# Patient Record
Sex: Male | Born: 1955
Health system: Southern US, Community
[De-identification: ages and names within clinical notes are randomized; demographics above are authoritative.]

## PROBLEM LIST (undated history)

## (undated) DIAGNOSIS — U071 COVID-19: Secondary | ICD-10-CM

## (undated) DIAGNOSIS — C3411 Malignant neoplasm of upper lobe, right bronchus or lung: Secondary | ICD-10-CM

## (undated) DIAGNOSIS — J449 Chronic obstructive pulmonary disease, unspecified: Secondary | ICD-10-CM

## (undated) DIAGNOSIS — M48061 Spinal stenosis, lumbar region without neurogenic claudication: Secondary | ICD-10-CM

## (undated) DIAGNOSIS — F1010 Alcohol abuse, uncomplicated: Secondary | ICD-10-CM

## (undated) DIAGNOSIS — F1411 Cocaine abuse, in remission: Secondary | ICD-10-CM

## (undated) DIAGNOSIS — M1990 Unspecified osteoarthritis, unspecified site: Secondary | ICD-10-CM

## (undated) DIAGNOSIS — M109 Gout, unspecified: Secondary | ICD-10-CM

## (undated) DIAGNOSIS — E785 Hyperlipidemia, unspecified: Secondary | ICD-10-CM

## (undated) DIAGNOSIS — T17500A Unspecified foreign body in bronchus causing asphyxiation, initial encounter: Secondary | ICD-10-CM

## (undated) DIAGNOSIS — E871 Hypo-osmolality and hyponatremia: Secondary | ICD-10-CM

## (undated) DIAGNOSIS — Z8489 Family history of other specified conditions: Secondary | ICD-10-CM

## (undated) DIAGNOSIS — N400 Enlarged prostate without lower urinary tract symptoms: Secondary | ICD-10-CM

## (undated) DIAGNOSIS — C44629 Squamous cell carcinoma of skin of left upper limb, including shoulder: Secondary | ICD-10-CM

## (undated) DIAGNOSIS — M5090 Cervical disc disorder, unspecified, unspecified cervical region: Secondary | ICD-10-CM

## (undated) DIAGNOSIS — J189 Pneumonia, unspecified organism: Secondary | ICD-10-CM

## (undated) DIAGNOSIS — K219 Gastro-esophageal reflux disease without esophagitis: Secondary | ICD-10-CM

## (undated) DIAGNOSIS — I10 Essential (primary) hypertension: Secondary | ICD-10-CM

## (undated) DIAGNOSIS — A419 Sepsis, unspecified organism: Secondary | ICD-10-CM

## (undated) DIAGNOSIS — Z720 Tobacco use: Secondary | ICD-10-CM

## (undated) DIAGNOSIS — R911 Solitary pulmonary nodule: Secondary | ICD-10-CM

## (undated) DIAGNOSIS — R000 Tachycardia, unspecified: Secondary | ICD-10-CM

## (undated) HISTORY — DX: Gout, unspecified: M10.9

## (undated) HISTORY — PX: NO PAST SURGERIES: SHX2092

## (undated) HISTORY — PX: OTHER SURGICAL HISTORY: SHX169

## (undated) HISTORY — DX: Malignant neoplasm of upper lobe, right bronchus or lung: C34.11

---

## 1991-04-07 DIAGNOSIS — K759 Inflammatory liver disease, unspecified: Secondary | ICD-10-CM

## 1991-04-07 HISTORY — DX: Inflammatory liver disease, unspecified: K75.9

## 2019-02-17 DIAGNOSIS — E538 Deficiency of other specified B group vitamins: Secondary | ICD-10-CM | POA: Diagnosis not present

## 2019-02-17 DIAGNOSIS — R739 Hyperglycemia, unspecified: Secondary | ICD-10-CM | POA: Diagnosis not present

## 2019-02-17 DIAGNOSIS — Z23 Encounter for immunization: Secondary | ICD-10-CM | POA: Diagnosis not present

## 2019-02-17 DIAGNOSIS — Z125 Encounter for screening for malignant neoplasm of prostate: Secondary | ICD-10-CM | POA: Diagnosis not present

## 2019-02-17 DIAGNOSIS — Z0000 Encounter for general adult medical examination without abnormal findings: Secondary | ICD-10-CM | POA: Diagnosis not present

## 2019-02-17 DIAGNOSIS — I10 Essential (primary) hypertension: Secondary | ICD-10-CM | POA: Diagnosis present

## 2019-02-17 DIAGNOSIS — E782 Mixed hyperlipidemia: Secondary | ICD-10-CM | POA: Diagnosis not present

## 2019-04-03 DIAGNOSIS — M659 Synovitis and tenosynovitis, unspecified: Secondary | ICD-10-CM | POA: Diagnosis not present

## 2019-04-03 DIAGNOSIS — I10 Essential (primary) hypertension: Secondary | ICD-10-CM | POA: Diagnosis not present

## 2019-04-03 DIAGNOSIS — Z1212 Encounter for screening for malignant neoplasm of rectum: Secondary | ICD-10-CM | POA: Diagnosis not present

## 2019-04-03 DIAGNOSIS — Z0000 Encounter for general adult medical examination without abnormal findings: Secondary | ICD-10-CM | POA: Diagnosis not present

## 2019-05-13 DIAGNOSIS — U071 COVID-19: Secondary | ICD-10-CM | POA: Diagnosis not present

## 2019-05-15 DIAGNOSIS — J1282 Pneumonia due to coronavirus disease 2019: Secondary | ICD-10-CM | POA: Diagnosis not present

## 2019-05-15 DIAGNOSIS — U071 COVID-19: Secondary | ICD-10-CM | POA: Diagnosis not present

## 2019-05-15 DIAGNOSIS — J208 Acute bronchitis due to other specified organisms: Secondary | ICD-10-CM | POA: Diagnosis not present

## 2019-05-16 DIAGNOSIS — J1282 Pneumonia due to coronavirus disease 2019: Secondary | ICD-10-CM | POA: Diagnosis not present

## 2019-05-16 DIAGNOSIS — U071 COVID-19: Secondary | ICD-10-CM | POA: Diagnosis not present

## 2019-05-18 DIAGNOSIS — U071 COVID-19: Secondary | ICD-10-CM | POA: Diagnosis not present

## 2019-05-18 DIAGNOSIS — J208 Acute bronchitis due to other specified organisms: Secondary | ICD-10-CM | POA: Diagnosis not present

## 2019-05-22 DIAGNOSIS — U071 COVID-19: Secondary | ICD-10-CM | POA: Diagnosis not present

## 2019-05-22 DIAGNOSIS — J208 Acute bronchitis due to other specified organisms: Secondary | ICD-10-CM | POA: Diagnosis not present

## 2019-06-21 DIAGNOSIS — J1282 Pneumonia due to coronavirus disease 2019: Secondary | ICD-10-CM | POA: Diagnosis not present

## 2019-06-21 DIAGNOSIS — U071 COVID-19: Secondary | ICD-10-CM | POA: Diagnosis not present

## 2019-06-21 DIAGNOSIS — R0600 Dyspnea, unspecified: Secondary | ICD-10-CM | POA: Diagnosis not present

## 2019-07-24 DIAGNOSIS — I10 Essential (primary) hypertension: Secondary | ICD-10-CM | POA: Diagnosis not present

## 2019-07-24 DIAGNOSIS — U071 COVID-19: Secondary | ICD-10-CM | POA: Diagnosis not present

## 2019-07-24 DIAGNOSIS — J1282 Pneumonia due to coronavirus disease 2019: Secondary | ICD-10-CM | POA: Diagnosis not present

## 2019-07-24 DIAGNOSIS — M5414 Radiculopathy, thoracic region: Secondary | ICD-10-CM | POA: Diagnosis not present

## 2019-07-24 DIAGNOSIS — Z125 Encounter for screening for malignant neoplasm of prostate: Secondary | ICD-10-CM | POA: Diagnosis not present

## 2019-07-24 DIAGNOSIS — Z79899 Other long term (current) drug therapy: Secondary | ICD-10-CM | POA: Diagnosis not present

## 2019-10-19 DIAGNOSIS — R55 Syncope and collapse: Secondary | ICD-10-CM | POA: Diagnosis not present

## 2019-11-02 DIAGNOSIS — R55 Syncope and collapse: Secondary | ICD-10-CM | POA: Diagnosis not present

## 2019-11-02 DIAGNOSIS — M109 Gout, unspecified: Secondary | ICD-10-CM | POA: Diagnosis not present

## 2019-11-02 DIAGNOSIS — I10 Essential (primary) hypertension: Secondary | ICD-10-CM | POA: Diagnosis not present

## 2019-11-02 DIAGNOSIS — Z1212 Encounter for screening for malignant neoplasm of rectum: Secondary | ICD-10-CM | POA: Diagnosis not present

## 2019-12-13 DIAGNOSIS — C44629 Squamous cell carcinoma of skin of left upper limb, including shoulder: Secondary | ICD-10-CM | POA: Diagnosis not present

## 2019-12-13 DIAGNOSIS — D485 Neoplasm of uncertain behavior of skin: Secondary | ICD-10-CM | POA: Diagnosis not present

## 2019-12-13 DIAGNOSIS — Z1212 Encounter for screening for malignant neoplasm of rectum: Secondary | ICD-10-CM | POA: Diagnosis not present

## 2019-12-17 LAB — COLOGUARD: COLOGUARD: NEGATIVE

## 2020-01-08 DIAGNOSIS — H2513 Age-related nuclear cataract, bilateral: Secondary | ICD-10-CM | POA: Diagnosis not present

## 2020-01-08 DIAGNOSIS — H5203 Hypermetropia, bilateral: Secondary | ICD-10-CM | POA: Diagnosis not present

## 2020-01-08 DIAGNOSIS — I10 Essential (primary) hypertension: Secondary | ICD-10-CM | POA: Diagnosis not present

## 2020-01-11 DIAGNOSIS — C44629 Squamous cell carcinoma of skin of left upper limb, including shoulder: Secondary | ICD-10-CM | POA: Diagnosis not present

## 2020-01-25 DIAGNOSIS — L570 Actinic keratosis: Secondary | ICD-10-CM | POA: Diagnosis not present

## 2020-01-25 DIAGNOSIS — L905 Scar conditions and fibrosis of skin: Secondary | ICD-10-CM | POA: Diagnosis not present

## 2020-02-12 DIAGNOSIS — Z125 Encounter for screening for malignant neoplasm of prostate: Secondary | ICD-10-CM | POA: Diagnosis not present

## 2020-02-12 DIAGNOSIS — Z79899 Other long term (current) drug therapy: Secondary | ICD-10-CM | POA: Diagnosis not present

## 2020-02-19 DIAGNOSIS — Z0000 Encounter for general adult medical examination without abnormal findings: Secondary | ICD-10-CM | POA: Diagnosis not present

## 2020-02-19 DIAGNOSIS — Z125 Encounter for screening for malignant neoplasm of prostate: Secondary | ICD-10-CM | POA: Diagnosis not present

## 2020-02-19 DIAGNOSIS — E782 Mixed hyperlipidemia: Secondary | ICD-10-CM | POA: Insufficient documentation

## 2020-02-19 DIAGNOSIS — C44629 Squamous cell carcinoma of skin of left upper limb, including shoulder: Secondary | ICD-10-CM | POA: Insufficient documentation

## 2020-11-08 DIAGNOSIS — M5010 Cervical disc disorder with radiculopathy, unspecified cervical region: Secondary | ICD-10-CM | POA: Diagnosis not present

## 2020-11-08 DIAGNOSIS — R29898 Other symptoms and signs involving the musculoskeletal system: Secondary | ICD-10-CM | POA: Diagnosis not present

## 2020-11-22 ENCOUNTER — Other Ambulatory Visit: Payer: Self-pay | Admitting: Internal Medicine

## 2020-11-22 DIAGNOSIS — M5010 Cervical disc disorder with radiculopathy, unspecified cervical region: Secondary | ICD-10-CM | POA: Diagnosis not present

## 2020-11-22 DIAGNOSIS — Z0000 Encounter for general adult medical examination without abnormal findings: Secondary | ICD-10-CM | POA: Diagnosis not present

## 2020-11-24 ENCOUNTER — Ambulatory Visit
Admission: RE | Admit: 2020-11-24 | Discharge: 2020-11-24 | Disposition: A | Discharge status: Home / Self Care | Payer: PPO | Source: Ambulatory Visit | Attending: Internal Medicine | Admitting: Internal Medicine

## 2020-11-24 DIAGNOSIS — M5010 Cervical disc disorder with radiculopathy, unspecified cervical region: Secondary | ICD-10-CM

## 2020-11-24 DIAGNOSIS — M542 Cervicalgia: Secondary | ICD-10-CM | POA: Diagnosis not present

## 2020-11-27 DIAGNOSIS — M5412 Radiculopathy, cervical region: Secondary | ICD-10-CM | POA: Diagnosis not present

## 2020-11-29 DIAGNOSIS — M5412 Radiculopathy, cervical region: Secondary | ICD-10-CM | POA: Diagnosis not present

## 2020-12-04 DIAGNOSIS — M5010 Cervical disc disorder with radiculopathy, unspecified cervical region: Secondary | ICD-10-CM | POA: Diagnosis not present

## 2020-12-12 DIAGNOSIS — M542 Cervicalgia: Secondary | ICD-10-CM | POA: Diagnosis not present

## 2020-12-12 DIAGNOSIS — M5412 Radiculopathy, cervical region: Secondary | ICD-10-CM | POA: Diagnosis not present

## 2020-12-12 DIAGNOSIS — M79601 Pain in right arm: Secondary | ICD-10-CM | POA: Diagnosis not present

## 2020-12-12 DIAGNOSIS — R293 Abnormal posture: Secondary | ICD-10-CM | POA: Diagnosis not present

## 2020-12-19 ENCOUNTER — Other Ambulatory Visit: Payer: Self-pay | Admitting: Neurosurgery

## 2020-12-19 DIAGNOSIS — M5412 Radiculopathy, cervical region: Secondary | ICD-10-CM | POA: Diagnosis not present

## 2020-12-24 ENCOUNTER — Other Ambulatory Visit: Payer: Self-pay

## 2020-12-24 ENCOUNTER — Encounter
Admission: RE | Admit: 2020-12-24 | Discharge: 2020-12-24 | Disposition: A | Discharge status: Home / Self Care | Payer: PPO | Source: Ambulatory Visit | Attending: Neurosurgery | Admitting: Neurosurgery

## 2020-12-24 DIAGNOSIS — I10 Essential (primary) hypertension: Secondary | ICD-10-CM | POA: Diagnosis not present

## 2020-12-24 DIAGNOSIS — Z01818 Encounter for other preprocedural examination: Secondary | ICD-10-CM | POA: Insufficient documentation

## 2020-12-24 HISTORY — DX: Squamous cell carcinoma of skin of left upper limb, including shoulder: C44.629

## 2020-12-24 HISTORY — DX: Essential (primary) hypertension: I10

## 2020-12-24 HISTORY — DX: Gastro-esophageal reflux disease without esophagitis: K21.9

## 2020-12-24 HISTORY — DX: Unspecified osteoarthritis, unspecified site: M19.90

## 2020-12-24 LAB — BASIC METABOLIC PANEL
Anion gap: 10 (ref 5–15)
BUN: 8 mg/dL (ref 8–23)
CO2: 29 mmol/L (ref 22–32)
Calcium: 9.5 mg/dL (ref 8.9–10.3)
Chloride: 100 mmol/L (ref 98–111)
Creatinine, Ser: 0.78 mg/dL (ref 0.61–1.24)
GFR, Estimated: >60 mL/min (ref >60)
Glucose, Bld: 112 mg/dL — ABNORMAL HIGH (ref 70–99)
Potassium: 3.9 mmol/L (ref 3.5–5.1)
Sodium: 139 mmol/L (ref 135–145)

## 2020-12-24 LAB — URINALYSIS, ROUTINE W REFLEX MICROSCOPIC
Bacteria, UA: NONE SEEN
Bilirubin Urine: NEGATIVE
Glucose, UA: NEGATIVE mg/dL
Hgb urine dipstick: NEGATIVE
Ketones, ur: NEGATIVE mg/dL
Leukocytes,Ua: NEGATIVE
Nitrite: NEGATIVE
Protein, ur: NEGATIVE mg/dL
Specific Gravity, Urine: 1.015 (ref 1.005–1.030)
Squamous Epithelial / HPF: NONE SEEN (ref 0–5)
pH: 7 (ref 5.0–8.0)

## 2020-12-24 LAB — TYPE AND SCREEN
ABO/RH(D): O POS
Antibody Screen: NEGATIVE

## 2020-12-24 LAB — PROTIME-INR
INR: 1 (ref 0.8–1.2)
Prothrombin Time: 12.9 seconds (ref 11.4–15.2)

## 2020-12-24 LAB — CBC
HCT: 40.3 % (ref 39.0–52.0)
Hemoglobin: 15.1 g/dL (ref 13.0–17.0)
MCH: 36.8 pg — ABNORMAL HIGH (ref 26.0–34.0)
MCHC: 37.5 g/dL — ABNORMAL HIGH (ref 30.0–36.0)
MCV: 98.3 fL (ref 80.0–100.0)
Platelets: 186 10*3/uL (ref 150–400)
RBC: 4.1 MIL/uL — ABNORMAL LOW (ref 4.22–5.81)
RDW: 13.7 % (ref 11.5–15.5)
WBC: 6.4 10*3/uL (ref 4.0–10.5)
nRBC: 0 % (ref 0.0–0.2)

## 2020-12-24 LAB — SURGICAL PCR SCREEN
MRSA, PCR: NEGATIVE
Staphylococcus aureus: NEGATIVE

## 2020-12-24 LAB — APTT: aPTT: 32 seconds (ref 24–36)

## 2020-12-24 NOTE — Patient Instructions (Addendum)
Your procedure is scheduled on: Wednesday, September 28 Report to the Registration Desk on the 1st floor of the [facility]. To find out your arrival time, please call [phone] between 1PM - 3PM on: Tuesday, September 27  REMEMBER: Instructions that are not followed completely may result in serious medical risk, up to and including death; or upon the discretion of your surgeon and anesthesiologist your surgery may need to be rescheduled.  Do not eat food after midnight the night before surgery.  No gum chewing, lozengers or hard candies.  You may however, drink CLEAR liquids up to 2 hours before you are scheduled to arrive for your surgery. Do not drink anything within 2 hours of your scheduled arrival time.  Clear liquids include: - water  - apple juice without pulp - gatorade (not RED, PURPLE, OR BLUE) - black coffee or tea (Do NOT add milk or creamers to the coffee or tea) Do NOT drink anything that is not on this list.  TAKE THESE MEDICATIONS THE MORNING OF SURGERY WITH A SIP OF WATER:  Omeprazole (prilosec) - (take one the night before and one on the morning of surgery - helps to prevent nausea after surgery.) Hydrocodone if needed for pain  One week prior to surgery: starting September 21 Stop ETODOLAC (Lodine) and Anti-inflammatories (NSAIDS) such as Advil, Aleve, Ibuprofen, Motrin, Naproxen, Naprosyn and Aspirin based products such as Excedrin, Goodys Powder, BC Powder. Stop ANY OVER THE COUNTER supplements until after surgery. You may however, continue to take Tylenol if needed for pain up until the day of surgery.  No Alcohol for 24 hours before or after surgery.  No Smoking including e-cigarettes for 24 hours prior to surgery.  No chewable tobacco products for at least 6 hours prior to surgery.  No nicotine patches on the day of surgery.  Do not use any "recreational" drugs for at least a week prior to your surgery.  Please be advised that the combination of  cocaine and anesthesia may have negative outcomes, up to and including death. If you test positive for cocaine, your surgery will be cancelled.  On the morning of surgery brush your teeth with toothpaste and water, you may rinse your mouth with mouthwash if you wish. Do not swallow any toothpaste or mouthwash.  Use CHG Soap as directed on instruction sheet.  Do not wear jewelry.  Do not wear lotions, powders, or perfumes.   Do not shave body from the neck down 48 hours prior to surgery just in case you cut yourself which could leave a site for infection.  Also, freshly shaved skin may become irritated if using the CHG soap.  Contact lenses, hearing aids and dentures may not be worn into surgery.  Do not bring valuables to the hospital. [facility] is not responsible for any missing/lost belongings or valuables.   Notify your doctor if there is any change in your medical condition (cold, fever, infection).  Wear comfortable clothing (specific to your surgery type) to the hospital.  After surgery, you can help prevent lung complications by doing breathing exercises.  Take deep breaths and cough every 1-2 hours. Your doctor may order a device called an Incentive Spirometer to help you take deep breaths.  If you are being discharged the day of surgery, you will not be allowed to drive home. You will need a responsible adult (18 years or older) to drive you home and stay with you that night.   If you are taking public transportation,  you will need to have a responsible adult (18 years or older) with you. Please confirm with your physician that it is acceptable to use public transportation.   Please call the [place] at [phone] if you have any questions about these instructions.  Surgery Visitation Policy:  Patients undergoing a surgery or procedure may have one family member or support person with them as long as that person is not COVID-19 positive or  experiencing its symptoms.  That person may remain in the waiting area during the procedure and may rotate out with other people.

## 2020-12-31 MED ORDER — LACTATED RINGERS IV SOLN: INTRAVENOUS | Status: DC

## 2020-12-31 MED ORDER — CHLORHEXIDINE GLUCONATE 0.12 % MT SOLN: 15.0000 mL | Freq: Once | OROMUCOSAL | Status: AC

## 2020-12-31 MED ORDER — CEFAZOLIN SODIUM-DEXTROSE 1-4 GM/50ML-% IV SOLN
1.0000 g | INTRAVENOUS | Status: AC
  Administered 2021-01-01: 1 g via INTRAVENOUS

## 2020-12-31 MED ORDER — ORAL CARE MOUTH RINSE: 15.0000 mL | Freq: Once | OROMUCOSAL | Status: AC

## 2021-01-01 ENCOUNTER — Ambulatory Visit: Payer: PPO

## 2021-01-01 ENCOUNTER — Encounter
Admission: RE | Disposition: A | Discharge status: Home / Self Care | Payer: Self-pay | Source: Home / Self Care | Attending: Neurosurgery

## 2021-01-01 ENCOUNTER — Ambulatory Visit: Payer: PPO | Admitting: Urgent Care

## 2021-01-01 ENCOUNTER — Ambulatory Visit
Admission: RE | Admit: 2021-01-01 | Discharge: 2021-01-01 | Disposition: A | Discharge status: Home / Self Care | Payer: PPO | Attending: Neurosurgery | Admitting: Neurosurgery

## 2021-01-01 ENCOUNTER — Encounter: Payer: Self-pay | Admitting: Neurosurgery

## 2021-01-01 ENCOUNTER — Ambulatory Visit: Payer: PPO | Admitting: Anesthesiology

## 2021-01-01 DIAGNOSIS — M5412 Radiculopathy, cervical region: Secondary | ICD-10-CM | POA: Insufficient documentation

## 2021-01-01 DIAGNOSIS — R531 Weakness: Secondary | ICD-10-CM | POA: Insufficient documentation

## 2021-01-01 DIAGNOSIS — R29898 Other symptoms and signs involving the musculoskeletal system: Secondary | ICD-10-CM | POA: Diagnosis not present

## 2021-01-01 DIAGNOSIS — K219 Gastro-esophageal reflux disease without esophagitis: Secondary | ICD-10-CM | POA: Diagnosis not present

## 2021-01-01 DIAGNOSIS — Z87891 Personal history of nicotine dependence: Secondary | ICD-10-CM | POA: Insufficient documentation

## 2021-01-01 DIAGNOSIS — Z8616 Personal history of COVID-19: Secondary | ICD-10-CM | POA: Diagnosis not present

## 2021-01-01 DIAGNOSIS — Z79899 Other long term (current) drug therapy: Secondary | ICD-10-CM | POA: Diagnosis not present

## 2021-01-01 DIAGNOSIS — Z9889 Other specified postprocedural states: Secondary | ICD-10-CM | POA: Diagnosis not present

## 2021-01-01 DIAGNOSIS — Z419 Encounter for procedure for purposes other than remedying health state, unspecified: Secondary | ICD-10-CM

## 2021-01-01 HISTORY — PX: ANTERIOR CERVICAL DECOMP/DISCECTOMY FUSION: SHX1161

## 2021-01-01 LAB — ABO/RH: ABO/RH(D): O POS

## 2021-01-01 SURGERY — ANTERIOR CERVICAL DECOMPRESSION/DISCECTOMY FUSION 1 LEVEL
Anesthesia: General

## 2021-01-01 MED ORDER — KETAMINE HCL 50 MG/5ML IJ SOSY
PREFILLED_SYRINGE | INTRAMUSCULAR | Status: AC
  Filled 2021-01-01: qty 5

## 2021-01-01 MED ORDER — CHLORHEXIDINE GLUCONATE 0.12 % MT SOLN
OROMUCOSAL | Status: AC
  Administered 2021-01-01: 15 mL via OROMUCOSAL
  Filled 2021-01-01: qty 15

## 2021-01-01 MED ORDER — FENTANYL CITRATE (PF) 100 MCG/2ML IJ SOLN
INTRAMUSCULAR | Status: AC
  Administered 2021-01-01: 25 ug via INTRAVENOUS
  Filled 2021-01-01: qty 2

## 2021-01-01 MED ORDER — PROPOFOL 500 MG/50ML IV EMUL
INTRAVENOUS | Status: DC | PRN
  Administered 2021-01-01: 150 ug/kg/min via INTRAVENOUS

## 2021-01-01 MED ORDER — FENTANYL CITRATE (PF) 100 MCG/2ML IJ SOLN
INTRAMUSCULAR | Status: AC
  Filled 2021-01-01: qty 2

## 2021-01-01 MED ORDER — ACETAMINOPHEN 10 MG/ML IV SOLN: 1000.0000 mg | Freq: Once | INTRAVENOUS | Status: DC | PRN

## 2021-01-01 MED ORDER — CEFAZOLIN SODIUM-DEXTROSE 1-4 GM/50ML-% IV SOLN
INTRAVENOUS | Status: AC
  Filled 2021-01-01: qty 50

## 2021-01-01 MED ORDER — SODIUM CHLORIDE 0.9 % IV SOLN
INTRAVENOUS | Status: DC | PRN
  Administered 2021-01-01: .2 ug/kg/min via INTRAVENOUS

## 2021-01-01 MED ORDER — DEXAMETHASONE SODIUM PHOSPHATE 10 MG/ML IJ SOLN
INTRAMUSCULAR | Status: AC
  Filled 2021-01-01: qty 1

## 2021-01-01 MED ORDER — FENTANYL CITRATE (PF) 100 MCG/2ML IJ SOLN
25.0000 ug | INTRAMUSCULAR | Status: DC | PRN
  Administered 2021-01-01: 25 ug via INTRAVENOUS
  Administered 2021-01-01: 50 ug via INTRAVENOUS

## 2021-01-01 MED ORDER — BUPIVACAINE-EPINEPHRINE (PF) 0.5% -1:200000 IJ SOLN
INTRAMUSCULAR | Status: DC | PRN
  Administered 2021-01-01: 5 mL

## 2021-01-01 MED ORDER — DEXAMETHASONE SODIUM PHOSPHATE 10 MG/ML IJ SOLN
INTRAMUSCULAR | Status: DC | PRN
  Administered 2021-01-01: 10 mg via INTRAVENOUS

## 2021-01-01 MED ORDER — PROPOFOL 1000 MG/100ML IV EMUL
INTRAVENOUS | Status: AC
  Filled 2021-01-01: qty 100

## 2021-01-01 MED ORDER — SODIUM CHLORIDE 0.9 % IV SOLN
INTRAVENOUS | Status: DC | PRN
  Administered 2021-01-01: 40 ug/min via INTRAVENOUS

## 2021-01-01 MED ORDER — METHOCARBAMOL 500 MG PO TABS: 500.0000 mg | ORAL_TABLET | Freq: Four times a day (QID) | ORAL | 0 refills | Status: DC

## 2021-01-01 MED ORDER — SENNA 8.6 MG PO TABS: 1.0000 | ORAL_TABLET | Freq: Every day | ORAL | 0 refills | Status: DC | PRN

## 2021-01-01 MED ORDER — FENTANYL CITRATE (PF) 100 MCG/2ML IJ SOLN
INTRAMUSCULAR | Status: DC | PRN
  Administered 2021-01-01: 100 ug via INTRAVENOUS

## 2021-01-01 MED ORDER — OXYCODONE HCL 5 MG PO TABS: 5.0000 mg | ORAL_TABLET | Freq: Two times a day (BID) | ORAL | 0 refills | Status: AC | PRN

## 2021-01-01 MED ORDER — SUCCINYLCHOLINE CHLORIDE 200 MG/10ML IV SOSY
PREFILLED_SYRINGE | INTRAVENOUS | Status: DC | PRN
  Administered 2021-01-01: 100 mg via INTRAVENOUS

## 2021-01-01 MED ORDER — METHOCARBAMOL 750 MG PO TABS: 750.0000 mg | ORAL_TABLET | Freq: Once | ORAL | Status: AC

## 2021-01-01 MED ORDER — METHOCARBAMOL 750 MG PO TABS
ORAL_TABLET | ORAL | Status: AC
  Administered 2021-01-01: 750 mg via ORAL
  Filled 2021-01-01: qty 1

## 2021-01-01 MED ORDER — HEMOSTATIC AGENTS (NO CHARGE) OPTIME
TOPICAL | Status: DC | PRN
  Administered 2021-01-01: 1 via TOPICAL

## 2021-01-01 MED ORDER — PHENYLEPHRINE HCL (PRESSORS) 10 MG/ML IV SOLN
INTRAVENOUS | Status: AC
  Filled 2021-01-01: qty 1

## 2021-01-01 MED ORDER — LIDOCAINE HCL (PF) 2 % IJ SOLN
INTRAMUSCULAR | Status: AC
  Filled 2021-01-01: qty 5

## 2021-01-01 MED ORDER — ONDANSETRON HCL 4 MG/2ML IJ SOLN
INTRAMUSCULAR | Status: AC
  Filled 2021-01-01: qty 2

## 2021-01-01 MED ORDER — OXYCODONE HCL 5 MG/5ML PO SOLN: 5.0000 mg | Freq: Once | ORAL | Status: AC | PRN

## 2021-01-01 MED ORDER — KETAMINE HCL 10 MG/ML IJ SOLN
INTRAMUSCULAR | Status: DC | PRN
  Administered 2021-01-01: 20 mg via INTRAVENOUS

## 2021-01-01 MED ORDER — REMIFENTANIL HCL 1 MG IV SOLR
INTRAVENOUS | Status: AC
  Filled 2021-01-01: qty 1000

## 2021-01-01 MED ORDER — OXYCODONE HCL 5 MG PO TABS
5.0000 mg | ORAL_TABLET | Freq: Once | ORAL | Status: AC | PRN
  Administered 2021-01-01: 5 mg via ORAL

## 2021-01-01 MED ORDER — MIDAZOLAM HCL 2 MG/2ML IJ SOLN
INTRAMUSCULAR | Status: DC | PRN
  Administered 2021-01-01: 2 mg via INTRAVENOUS

## 2021-01-01 MED ORDER — PHENYLEPHRINE HCL (PRESSORS) 10 MG/ML IV SOLN
INTRAVENOUS | Status: DC | PRN
  Administered 2021-01-01: 100 ug via INTRAVENOUS

## 2021-01-01 MED ORDER — ONDANSETRON HCL 4 MG/2ML IJ SOLN: 4.0000 mg | Freq: Once | INTRAMUSCULAR | Status: DC | PRN

## 2021-01-01 MED ORDER — LIDOCAINE HCL (CARDIAC) PF 100 MG/5ML IV SOSY
PREFILLED_SYRINGE | INTRAVENOUS | Status: DC | PRN
Start: 2021-01-01 — End: 2021-01-01
  Administered 2021-01-01: 100 mg via INTRAVENOUS

## 2021-01-01 MED ORDER — MIDAZOLAM HCL 2 MG/2ML IJ SOLN
INTRAMUSCULAR | Status: AC
  Filled 2021-01-01: qty 2

## 2021-01-01 MED ORDER — 0.9 % SODIUM CHLORIDE (POUR BTL) OPTIME
TOPICAL | Status: DC | PRN
  Administered 2021-01-01: 1000 mL

## 2021-01-01 MED ORDER — ONDANSETRON HCL 4 MG/2ML IJ SOLN
INTRAMUSCULAR | Status: DC | PRN
  Administered 2021-01-01: 4 mg via INTRAVENOUS

## 2021-01-01 MED ORDER — PROPOFOL 10 MG/ML IV BOLUS
INTRAVENOUS | Status: DC | PRN
  Administered 2021-01-01: 150 mg via INTRAVENOUS

## 2021-01-01 MED ORDER — OXYCODONE HCL 5 MG PO TABS
ORAL_TABLET | ORAL | Status: AC
  Filled 2021-01-01: qty 1

## 2021-01-01 SURGICAL SUPPLY — 65 items
ADH SKN CLS APL DERMABOND .7 (GAUZE/BANDAGES/DRESSINGS) ×1
AGENT HMST KT MTR STRL THRMB (HEMOSTASIS) ×1
APL PRP STRL LF DISP 70% ISPRP (MISCELLANEOUS) ×2
BASKET BONE COLLECTION (BASKET) IMPLANT
BULB RESERV EVAC DRAIN JP 100C (MISCELLANEOUS) IMPLANT
BUR NEURO DRILL SOFT 3.0X3.8M (BURR) ×2 IMPLANT
CHLORAPREP W/TINT 26 (MISCELLANEOUS) ×4 IMPLANT
COUNTER NEEDLE 20/40 LG (NEEDLE) ×2 IMPLANT
CUP MEDICINE 2OZ PLAST GRAD ST (MISCELLANEOUS) ×2 IMPLANT
DERMABOND ADVANCED (GAUZE/BANDAGES/DRESSINGS) ×1
DERMABOND ADVANCED .7 DNX12 (GAUZE/BANDAGES/DRESSINGS) ×1 IMPLANT
DRAIN CHANNEL JP 10F RND 20C F (MISCELLANEOUS) IMPLANT
DRAPE C ARM PK CFD 31 SPINE (DRAPES) ×2 IMPLANT
DRAPE LAPAROTOMY 77X122 PED (DRAPES) ×2 IMPLANT
DRAPE MICROSCOPE SPINE 48X150 (DRAPES) ×2 IMPLANT
DRAPE SURG 17X11 SM STRL (DRAPES) ×8 IMPLANT
ELECT CAUTERY BLADE TIP 2.5 (TIP) ×2
ELECT REM PT RETURN 9FT ADLT (ELECTROSURGICAL) ×2
ELECTRODE CAUTERY BLDE TIP 2.5 (TIP) ×1 IMPLANT
ELECTRODE REM PT RTRN 9FT ADLT (ELECTROSURGICAL) ×1 IMPLANT
FEE INTRAOP CADWELL SUPPLY NCS (MISCELLANEOUS) ×1 IMPLANT
FEE INTRAOP MONITOR IMPULS NCS (MISCELLANEOUS) IMPLANT
GAUZE 4X4 16PLY ~~LOC~~+RFID DBL (SPONGE) ×2 IMPLANT
GLOVE SURG SYN 6.5 ES PF (GLOVE) ×2 IMPLANT
GLOVE SURG SYN 6.5 PF PI (GLOVE) ×1 IMPLANT
GLOVE SURG SYN 8.5  E (GLOVE) ×3
GLOVE SURG SYN 8.5 E (GLOVE) ×3 IMPLANT
GLOVE SURG SYN 8.5 PF PI (GLOVE) ×3 IMPLANT
GLOVE SURG UNDER POLY LF SZ6.5 (GLOVE) ×2 IMPLANT
GOWN SRG LRG LVL 4 IMPRV REINF (GOWNS) ×1 IMPLANT
GOWN SRG XL LVL 3 NONREINFORCE (GOWNS) ×1 IMPLANT
GOWN STRL NON-REIN TWL XL LVL3 (GOWNS) ×2
GOWN STRL REIN LRG LVL4 (GOWNS) ×2
GRADUATE 1200CC STRL 31836 (MISCELLANEOUS) ×2 IMPLANT
INTRAOP CADWELL SUPPLY FEE NCS (MISCELLANEOUS) ×1
INTRAOP DISP SUPPLY FEE NCS (MISCELLANEOUS) ×2
INTRAOP MONITOR FEE IMPULS NCS (MISCELLANEOUS)
INTRAOP MONITOR FEE IMPULSE (MISCELLANEOUS)
KIT TURNOVER KIT A (KITS) ×2 IMPLANT
MANIFOLD NEPTUNE II (INSTRUMENTS) ×2 IMPLANT
MARKER SKIN DUAL TIP RULER LAB (MISCELLANEOUS) ×4 IMPLANT
NDL SAFETY ECLIPSE 18X1.5 (NEEDLE) ×1 IMPLANT
NEEDLE HYPO 18GX1.5 SHARP (NEEDLE) ×2
NEEDLE HYPO 22GX1.5 SAFETY (NEEDLE) ×2 IMPLANT
NS IRRIG 1000ML POUR BTL (IV SOLUTION) ×2 IMPLANT
PACK LAMINECTOMY NEURO (CUSTOM PROCEDURE TRAY) ×2 IMPLANT
PAD ARMBOARD 7.5X6 YLW CONV (MISCELLANEOUS) ×4 IMPLANT
PIN CASPAR 14 (PIN) ×1 IMPLANT
PIN CASPAR 14MM (PIN) ×2 IMPLANT
PLATE ANT CERV XTEND 1 LV 12 (Plate) ×1 IMPLANT
PUTTY DBX 1CC (Putty) ×2 IMPLANT
PUTTY DBX 1CC DEPUY (Putty) IMPLANT
SCREW VAR 4.2 XD SELF DRILL 16 (Screw) ×4 IMPLANT
SPACER HEDRON C 12X14X8 7D (Spacer) ×1 IMPLANT
SPONGE KITTNER 5P (MISCELLANEOUS) ×2 IMPLANT
STAPLER SKIN PROX 35W (STAPLE) IMPLANT
SURGIFLO W/THROMBIN 8M KIT (HEMOSTASIS) ×2 IMPLANT
SUT V-LOC 90 ABS DVC 3-0 CL (SUTURE) ×2 IMPLANT
SUT VIC AB 3-0 SH 8-18 (SUTURE) ×2 IMPLANT
SYR 30ML LL (SYRINGE) ×2 IMPLANT
TAPE CLOTH 3X10 WHT NS LF (GAUZE/BANDAGES/DRESSINGS) ×2 IMPLANT
TOWEL OR 17X26 4PK STRL BLUE (TOWEL DISPOSABLE) ×6 IMPLANT
TRAY FOLEY MTR SLVR 16FR STAT (SET/KITS/TRAYS/PACK) IMPLANT
TUBING CONNECTING 10 (TUBING) ×2 IMPLANT
WATER STERILE IRR 500ML POUR (IV SOLUTION) ×2 IMPLANT

## 2021-01-01 NOTE — Anesthesia Postprocedure Evaluation (Signed)
Anesthesia Post Note  Patient: David Clark  Procedure(s) Performed: C5-6 ANTERIOR CERVICAL DECOMPRESSION/DISCECTOMY FUSION 1 LEVEL  Patient location during evaluation: PACU Anesthesia Type: General Level of consciousness: awake and alert Pain management: pain level controlled Vital Signs Assessment: post-procedure vital signs reviewed and stable Respiratory status: spontaneous breathing, nonlabored ventilation, respiratory function stable and patient connected to nasal cannula oxygen Cardiovascular status: blood pressure returned to baseline and stable Postop Assessment: no apparent nausea or vomiting Anesthetic complications: no   No notable events documented.   Last Vitals:  Vitals:   01/01/21 0955 01/01/21 1004  BP:  (!) 160/98  Pulse: 87 90  Resp: 10 18  Temp:  (!) 36.3 C  SpO2: 94% 91%    Last Pain:  Vitals:   01/01/21 1004  TempSrc: Temporal  PainSc: 8                  Arita Miss

## 2021-01-01 NOTE — Discharge Summary (Signed)
Physician Discharge Summary  Patient ID: David Clark MRN: [id number] DOB/AGE: 1955/11/23 65 y.o.  Admit date: 01/01/2021 Discharge date: 01/01/2021  Admission Diagnoses: cervical radiculopathy   Discharge Diagnoses:  Active Problems:   * No active hospital problems. *   Discharged Condition: good  Hospital Course: David Clark is a 65 y.o presenting for C5-6 ACDF. His interoperative course was uncomplicated. He was monitored in the PACU for 4 hours and discharged home once he was able to ambulate, urinate and tolerating PO intact.   Consults: None  Significant Diagnostic Studies: None   Treatments: surgery: C5-6 ACDF  Discharge Exam: Blood pressure (!) 148/98, pulse 98, temperature 98 F (36.7 C), temperature source Temporal, resp. rate 14, weight 86.6 kg, SpO2 100 %. CN II-XII grossly intact 5/5 throughout BUE and BLE  Disposition: Discharge disposition: 01-Home or Self Care        Allergies as of 01/01/2021   No Known Allergies      Medication List     STOP taking these medications    etodolac 400 MG tablet Commonly known as: LODINE       TAKE these medications    amLODipine 5 MG tablet Commonly known as: NORVASC Take 5 mg by mouth at bedtime.   diclofenac Sodium 1 % Gel Commonly known as: VOLTAREN Apply 2 g topically daily as needed for pain.   gabapentin 300 MG capsule Commonly known as: NEURONTIN Take 300 mg by mouth at bedtime.   HYDROcodone-acetaminophen 5-325 MG tablet Commonly known as: NORCO/VICODIN Take 2 tablets by mouth in the morning and at bedtime.   methocarbamol 500 MG tablet Commonly known as: Robaxin Take 1 tablet (500 mg total) by mouth 4 (four) times daily.   olmesartan-hydrochlorothiazide 20-12.5 MG tablet Commonly known as: BENICAR HCT Take 1 tablet by mouth daily.   omeprazole 20 MG capsule Commonly known as: PRILOSEC Take 20 mg by mouth daily.   oxyCODONE 5 MG immediate release tablet Commonly known as:  Roxicodone Take 1 tablet (5 mg total) by mouth 2 (two) times daily as needed for up to 5 days for severe pain (post-op pain refractory to home Norco dose).   senna 8.6 MG Tabs tablet Commonly known as: SENOKOT Take 1 tablet (8.6 mg total) by mouth daily as needed for mild constipation.   vitamin B-12 1000 MCG tablet Commonly known as: CYANOCOBALAMIN Take 1,000 mcg by mouth daily.        Follow-up Information     Loleta Dicker, PA Follow up in 2 week(s).   Why: For incision check. Appointment should already be scheduled through the [facility]. Please call the office with any questions or concerns about appointment date or time Contact information: [place] [address] [phone]                 Signed: Loleta Dicker 01/01/2021, 9:01 AM

## 2021-01-01 NOTE — H&P (Signed)
I have reviewed and confirmed my history and physical from 12/19/2020 with no additions or changes. Plan for ACDF C5-6.  Risks and benefits reviewed.  Heart sounds normal no MRG. Chest Clear to Auscultation Bilaterally.

## 2021-01-01 NOTE — Anesthesia Preprocedure Evaluation (Signed)
Anesthesia Evaluation    Airway Mallampati: II  TM Distance: >3 FB Neck ROM: Limited    Dental  (+) Edentulous Upper, Edentulous Lower   Pulmonary Current Smoker and Patient abstained from smoking.,    Pulmonary exam normal breath sounds clear to auscultation       Cardiovascular hypertension, Pt. on medications  Rhythm:Regular Rate:Normal - Systolic murmurs    Neuro/Psych    GI/Hepatic GERD  Medicated,(+)     substance abuse  alcohol use, 3 beers per day   Endo/Other    Renal/GU      Musculoskeletal   Abdominal   Peds  Hematology   Anesthesia Other Findings   Reproductive/Obstetrics                             Anesthesia Physical Anesthesia Plan  ASA: 3  Anesthesia Plan: General   Post-op Pain Management:    Induction: Intravenous  PONV Risk Score and Plan: 2 and Ondansetron, Dexamethasone, TIVA, Propofol infusion, Treatment may vary due to age or medical condition and Midazolam  Airway Management Planned: Oral ETT  Additional Equipment: None  Intra-op Plan:   Post-operative Plan: Extubation in OR  Informed Consent: I have reviewed the patients History and Physical, chart, labs and discussed the procedure including the risks, benefits and alternatives for the proposed anesthesia with the patient or authorized representative who has indicated his/her understanding and acceptance.     Dental advisory given  Plan Discussed with: CRNA and Surgeon  Anesthesia Plan Comments: (Discussed risks of anesthesia with patient, including PONV, sore throat, lip/dental damage. Rare risks discussed as well, such as cardiorespiratory and neurological sequelae, and allergic reactions. Patient understands. Patient counseled on benefits of smoking cessation, and increased perioperative risks associated with continued smoking. )        Anesthesia Quick Evaluation

## 2021-01-01 NOTE — Anesthesia Procedure Notes (Signed)
Procedure Name: Intubation Date/Time: 01/01/2021 7:28 AM Performed by: Guadlupe Spanish, RN Pre-anesthesia Checklist: Patient identified, Patient being monitored, Timeout performed, Emergency Drugs available and Suction available Patient Re-evaluated:Patient Re-evaluated prior to induction Oxygen Delivery Method: Circle system utilized Preoxygenation: Pre-oxygenation with 100% oxygen Induction Type: IV induction Ventilation: Mask ventilation with difficulty and Oral airway inserted - appropriate to patient size Laryngoscope Size: Mac and 4 Grade View: Grade II Tube type: Oral Tube size: 7.5 mm Number of attempts: 1 Airway Equipment and Method: Stylet, Video-laryngoscopy and Oral airway Placement Confirmation: ETT inserted through vocal cords under direct vision, positive ETCO2 and breath sounds checked- equal and bilateral Secured at: 22 cm Tube secured with: Tape Dental Injury: Teeth and Oropharynx as per pre-operative assessment

## 2021-01-01 NOTE — Transfer of Care (Signed)
Immediate Anesthesia Transfer of Care Note  Patient: David Clark  Procedure(s) Performed: C5-6 ANTERIOR CERVICAL DECOMPRESSION/DISCECTOMY FUSION 1 LEVEL  Patient Location: PACU  Anesthesia Type:General  Level of Consciousness: drowsy and patient cooperative  Airway & Oxygen Therapy: Patient Spontanous Breathing and Patient connected to face mask oxygen  Post-op Assessment: Report given to RN and Post -op Vital signs reviewed and stable  Post vital signs: Reviewed  Last Vitals:  Vitals Value Taken Time  BP 139/97 01/01/21 0901  Temp    Pulse 92 01/01/21 0905  Resp 15 01/01/21 0905  SpO2 95 % 01/01/21 0905  Vitals shown include unvalidated device data.  Last Pain:  Vitals:   01/01/21 0622  TempSrc: Temporal  PainSc: 8          Complications: No notable events documented.

## 2021-01-01 NOTE — Op Note (Signed)
Indications: David Clark is suffering from cervical radiculopathy and right arm weakness. he failed conservative management, and elected to proceed with surgery.  Findings: disc herniation C5-6  Preoperative Diagnosis: Cervical radiculopathy, right arm weakness Postoperative Diagnosis: same   EBL: 20 ml IVF: see AR ml Drains: none Disposition: Extubated and Stable to PACU Complications: none  No foley catheter was placed.   Preoperative Note:   Risks of surgery discussed include: infection, bleeding, stroke, coma, death, paralysis, CSF leak, nerve/spinal cord injury, numbness, tingling, weakness, complex regional pain syndrome, recurrent stenosis and/or disc herniation, vascular injury, development of instability, neck/back pain, need for further surgery, persistent symptoms, development of deformity, and the risks of anesthesia. The patient understood these risks and agreed to proceed.  Operative Note:   Procedure:  1) Anterior cervical diskectomy and fusion at C5-6 2) Anterior cervical instrumentation at C5-6 using Globus Xtend 3) Insertion of biomechanical device at C5-6   Procedure: After obtaining informed consent, the patient taken to the operating room, placed in supine position, general anesthesia induced.  The patient had a small shoulder roll placed behind their shoulders.  The patient received preop antibiotics and IV Decadron.  The patient had a neck incision outlined, was prepped and draped in usual sterile fashion. The incision was injected with local anesthetic.   An incision was opened, dissection taken down medial to the carotid artery and jugular vein, lateral to the trachea and esophagus.  The prevertebral fascia was identified, and a localizing x-ray demonstrated the correct level.  The longus colli were dissected laterally, and self-retaining retractors placed to open the operative field. The microscope was then brought into the field.  With this complete,  distractor pins were placed in the vertebral bodies of C5 and C6. The distractor was placed, and the annulus at C5/6 was opened using a bovie.  Curettes and pituitary rongeurs used to remove the majority of disk, then the drill was used to remove the posterior osteophyte and begin the foraminotomies. The nerve hook was used to elevate the posterior longitudinal ligament, which was then removed with Kerrison rongeurs. Bilateral foraminotomies were performed. The microblunt nerve hook could be passed out the foramen bilaterally.   Meticulous hemostasis was obtained.  A biomechanical device (Globus Hedron C 8 mm height x 14 mm width by 12 mm depth) was placed at C5/6. The device had been filled with allograft for aid in arthrodesis.  The caspar distractor was removed, and bone wax used for hemostasis. A 12 mm Globus Xtend plate was chosen.  Two screws placed in each vertebral body, respectively making sure the screws were behind the locking mechanism.  Final AP and lateral radiographs were taken.   With everything in good position, the wound was irrigated copiously with bacitracin-containing solution and meticulous hemostasis obtained.  Wound was closed in 2 layers using interrupted inverted 3-0 Vicryl sutures.  The wound was dressed with dermabond, the head of bed at 30 degrees, taken to recovery room in stable condition.  No new postop neurological deficits were identified.  Sponge and pattie counts were correct at the end of the procedure.   I performed the entire procedure with the assistance of Cooper Render PA as an Pensions consultant.  Meade Maw MD

## 2021-01-01 NOTE — Discharge Instructions (Addendum)
Your surgeon has performed an operation on your cervical spine (neck) to relieve pressure on the spinal cord and/or nerves. This involved making an incision in the front of your neck and removing one or more of the discs that support your spine. Next, a small piece of bone, a titanium plate, and screws were used to fuse two or more of the vertebrae (bones) together.  The following are instructions to help in your recovery once you have been discharged from the hospital. Even if you feel well, it is important that you follow these activity guidelines. If you do not let your neck heal properly from the surgery, you can increase the chance of return of your symptoms and other complications.  * Do not take anti-inflammatory medications for 3 months after surgery (naproxen [Aleve], ibuprofen [Advil, Motrin], celecoxib [Celebrex], etc.). These medications can prevent your bones from healing properly.  Activity    No bending, lifting, or twisting ("BLT"). Avoid lifting objects heavier than 10 pounds (gallon milk jug).  Where possible, avoid household activities that involve lifting, bending, reaching, pushing, or pulling such as laundry, vacuuming, grocery shopping, and childcare. Try to arrange for help from friends and family for these activities while your back heals.  Increase physical activity slowly as tolerated.  Taking short walks is encouraged, but avoid strenuous exercise. Do not jog, run, bicycle, lift weights, or participate in any other exercises unless specifically allowed by your doctor.  Talk to your doctor before resuming sexual activity.  You should not drive until cleared by your doctor.  Until released by your doctor, you should not return to work or school.  You should rest at home and let your body heal.   You may shower three days after your surgery.  After showering, lightly dab your incision dry. Do not take a tub bath or go swimming until approved by your doctor at your  follow-up appointment.  If your doctor ordered a cervical collar (neck brace) for you, you should wear it whenever you are out of bed. You may remove it when lying down or sleeping, but you should wear it at all other times. Not all neck surgeries require a cervical collar.  If you smoke, we strongly recommend that you quit.  Smoking has been proven to interfere with normal bone healing and will dramatically reduce the success rate of your surgery. Please contact [facility] (800-QUIT-NOW) and use the resources at www.[facility].com for assistance in stopping smoking.  Surgical Incision   If you have a dressing on your incision, you may remove it two days after your surgery. Keep your incision area clean and dry.  If you have staples or stitches on your incision, you should have a follow up scheduled for removal. If you do not have staples or stitches, you will have steri-strips (small pieces of surgical tape) or Dermabond glue. The steri-strips/glue should begin to peel away within about a week (it is fine if the steri-strips fall off before then). If the strips are still in place one week after your surgery, you may gently remove them.  Diet           You may return to your usual diet. However, you may experience discomfort when swallowing in the first month after your surgery. This is normal. You may find that softer foods are more comfortable for you to swallow. Be sure to stay hydrated.  When to Contact us  You may experience pain in your neck and/or pain between your shoulder  blades. This is normal and should improve in the next few weeks with the help of pain medication, muscle relaxers, and rest. Some patients report that a warm compress on the back of the neck or between the shoulder blades helps.  However, should you experience any of the following, contact us immediately: New numbness or weakness Pain that is progressively getting worse, and is not relieved by your pain medication,  muscle relaxers, rest, and warm compresses Bleeding, redness, swelling, pain, or drainage from surgical incision Chills or flu-like symptoms Fever greater than 101.0 F (38.3 C) Inability to eat, drink fluids, or take medications Problems with bowel or bladder functions Difficulty breathing or shortness of breath Warmth, tenderness, or swelling in your calf Contact Information During office hours (Monday-Friday 9 am to 5 pm), please call your physician at [phone] and ask for Berdine Addison After hours and weekends, please call [phone] and speak with the answering service, who will contact the doctor on call.  If that fails, call the [facility] Operator at [phone] and ask for the Neurosurgery Resident On Call  For a life-threatening emergency, call [place]   The drugs that you were given will stay in your system until tomorrow so for the next 24 hours you should not:  Drive an automobile Make any legal decisions Drink any alcoholic beverage   You may resume regular meals tomorrow.  Today it is better to start with liquids and gradually work up to solid foods.  You may eat anything you prefer, but it is better to start with liquids, then soup and crackers, and gradually work up to solid foods.   Please notify your doctor immediately if you have any unusual bleeding, trouble breathing, redness and pain at the surgery site, drainage, fever, or pain not relieved by medication.    Additional Instructions:        Please contact your physician with any problems or Same Day Surgery at [phone], Monday through Friday 6 am to 4 pm, or [place] [facility] number at [phone].

## 2021-01-01 NOTE — Final Progress Note (Signed)
BP 154/114 denies dizzyness, HA, visual disturbance.   Informed Dr. Bertell Maria.  Dr. Bertell Maria stated OK to d/c home.  Instructed pt to take home BP meds.  Pt stated understanding.

## 2021-01-01 NOTE — Progress Notes (Signed)
Patient ambulated SDS halls without any issues.

## 2021-01-16 NOTE — Progress Notes (Signed)
 [facility]  REFERRING PHYSICIAN:  Clois Reeves [place], [place] [address] [place],  [place] [address]  DOS: 01/01/21 C5-6 ACDF   HISTORY OF PRESENT ILLNESS: David Clark is about 2 weeks status post C5-6 ACDF. Overall, he is doing fairly well post-op.  He reports complete resolution of his right radiating arm pain and right arm weakness that he experienced preoperatively.  His primary concern at this time is intrascapular pain and neck discomfort.  He continues to take Robaxin  as needed but is no longer taking oxycodone .  He denies any incisional concerns.  PHYSICAL EXAMINATION:  NEUROLOGICAL:  General: In no acute distress.   Awake, alert, oriented to person, place, and time.    Strength: Side Biceps Triceps Deltoid Interossei Grip Wrist Ext. Wrist Flex.  R 5 5 5 5 5 5 5   L 5 5 5 5 5 5 5    Incision c/d/I  Imaging:  No new imaging to review  Assessment / Plan: David Clark is doing fairly well after recent ACDF.  We discussed that interscapular pain after this type of surgery is not uncommon.  He also endorsed some difficulty with swallowing but is tolerating a regular diet which is not abnormal at this time.  We discussed activity escalation and I have advised the patient to lift up to 10 pounds until 6 weeks after surgery, then increase up to 25 pounds until 12 weeks after surgery.  After 12 weeks post-op, the patient advised to increase activity as tolerated. he will return to clinic in approximately 4 weeks to see Dr. Clois with cervical x-rays prior.  He was however encouraged to call the office in the interim should he develop any questions or concerns.  He expressed understanding and was in agreement this plan  I personally performed the service, non-incident to. (WP)   EDSEL JAMA GOODS, PA    Dept of Neurosurgery

## 2021-02-12 DIAGNOSIS — Z125 Encounter for screening for malignant neoplasm of prostate: Secondary | ICD-10-CM | POA: Diagnosis not present

## 2021-02-12 DIAGNOSIS — E782 Mixed hyperlipidemia: Secondary | ICD-10-CM | POA: Diagnosis not present

## 2021-02-13 DIAGNOSIS — Z981 Arthrodesis status: Secondary | ICD-10-CM | POA: Diagnosis not present

## 2021-02-13 DIAGNOSIS — M4322 Fusion of spine, cervical region: Secondary | ICD-10-CM | POA: Diagnosis not present

## 2021-02-13 DIAGNOSIS — I6523 Occlusion and stenosis of bilateral carotid arteries: Secondary | ICD-10-CM | POA: Diagnosis not present

## 2021-02-19 DIAGNOSIS — Z125 Encounter for screening for malignant neoplasm of prostate: Secondary | ICD-10-CM | POA: Diagnosis not present

## 2021-02-19 DIAGNOSIS — E538 Deficiency of other specified B group vitamins: Secondary | ICD-10-CM | POA: Diagnosis not present

## 2021-02-19 DIAGNOSIS — M5090 Cervical disc disorder, unspecified, unspecified cervical region: Secondary | ICD-10-CM | POA: Diagnosis not present

## 2021-02-19 DIAGNOSIS — E782 Mixed hyperlipidemia: Secondary | ICD-10-CM | POA: Diagnosis not present

## 2021-02-19 DIAGNOSIS — Z0000 Encounter for general adult medical examination without abnormal findings: Secondary | ICD-10-CM | POA: Diagnosis not present

## 2021-05-12 DIAGNOSIS — H5203 Hypermetropia, bilateral: Secondary | ICD-10-CM | POA: Diagnosis not present

## 2021-05-12 DIAGNOSIS — I10 Essential (primary) hypertension: Secondary | ICD-10-CM | POA: Diagnosis not present

## 2021-05-12 DIAGNOSIS — H353131 Nonexudative age-related macular degeneration, bilateral, early dry stage: Secondary | ICD-10-CM | POA: Diagnosis not present

## 2021-05-12 DIAGNOSIS — H2513 Age-related nuclear cataract, bilateral: Secondary | ICD-10-CM | POA: Diagnosis not present

## 2022-02-16 DIAGNOSIS — Z125 Encounter for screening for malignant neoplasm of prostate: Secondary | ICD-10-CM | POA: Diagnosis not present

## 2022-02-16 DIAGNOSIS — E782 Mixed hyperlipidemia: Secondary | ICD-10-CM | POA: Diagnosis not present

## 2022-02-16 DIAGNOSIS — R5383 Other fatigue: Secondary | ICD-10-CM | POA: Diagnosis not present

## 2022-02-16 DIAGNOSIS — E538 Deficiency of other specified B group vitamins: Secondary | ICD-10-CM | POA: Diagnosis not present

## 2022-02-23 DIAGNOSIS — Z0000 Encounter for general adult medical examination without abnormal findings: Secondary | ICD-10-CM | POA: Diagnosis not present

## 2022-02-23 DIAGNOSIS — Z125 Encounter for screening for malignant neoplasm of prostate: Secondary | ICD-10-CM | POA: Diagnosis not present

## 2022-02-23 DIAGNOSIS — R000 Tachycardia, unspecified: Secondary | ICD-10-CM | POA: Diagnosis not present

## 2022-02-23 DIAGNOSIS — Z8042 Family history of malignant neoplasm of prostate: Secondary | ICD-10-CM | POA: Insufficient documentation

## 2022-02-23 DIAGNOSIS — E782 Mixed hyperlipidemia: Secondary | ICD-10-CM | POA: Diagnosis not present

## 2022-02-23 DIAGNOSIS — R972 Elevated prostate specific antigen [PSA]: Secondary | ICD-10-CM | POA: Diagnosis not present

## 2022-02-23 DIAGNOSIS — R059 Cough, unspecified: Secondary | ICD-10-CM | POA: Diagnosis not present

## 2022-02-23 DIAGNOSIS — I10 Essential (primary) hypertension: Secondary | ICD-10-CM | POA: Diagnosis not present

## 2022-02-23 DIAGNOSIS — R053 Chronic cough: Secondary | ICD-10-CM | POA: Diagnosis not present

## 2022-03-28 ENCOUNTER — Other Ambulatory Visit: Payer: Self-pay

## 2022-03-28 ENCOUNTER — Emergency Department: Payer: PPO

## 2022-03-28 ENCOUNTER — Observation Stay
Admission: EM | Admit: 2022-03-28 | Discharge: 2022-03-29 | Disposition: A | Discharge status: Home / Self Care | Payer: PPO | Attending: Internal Medicine | Admitting: Internal Medicine

## 2022-03-28 DIAGNOSIS — R911 Solitary pulmonary nodule: Secondary | ICD-10-CM | POA: Diagnosis not present

## 2022-03-28 DIAGNOSIS — R059 Cough, unspecified: Secondary | ICD-10-CM | POA: Insufficient documentation

## 2022-03-28 DIAGNOSIS — J9601 Acute respiratory failure with hypoxia: Secondary | ICD-10-CM | POA: Insufficient documentation

## 2022-03-28 DIAGNOSIS — I10 Essential (primary) hypertension: Secondary | ICD-10-CM | POA: Insufficient documentation

## 2022-03-28 DIAGNOSIS — N400 Enlarged prostate without lower urinary tract symptoms: Secondary | ICD-10-CM | POA: Diagnosis not present

## 2022-03-28 DIAGNOSIS — Z1152 Encounter for screening for COVID-19: Secondary | ICD-10-CM | POA: Insufficient documentation

## 2022-03-28 DIAGNOSIS — J189 Pneumonia, unspecified organism: Principal | ICD-10-CM | POA: Insufficient documentation

## 2022-03-28 DIAGNOSIS — R000 Tachycardia, unspecified: Secondary | ICD-10-CM | POA: Diagnosis not present

## 2022-03-28 DIAGNOSIS — Z85828 Personal history of other malignant neoplasm of skin: Secondary | ICD-10-CM | POA: Insufficient documentation

## 2022-03-28 DIAGNOSIS — J441 Chronic obstructive pulmonary disease with (acute) exacerbation: Secondary | ICD-10-CM | POA: Diagnosis not present

## 2022-03-28 DIAGNOSIS — A419 Sepsis, unspecified organism: Secondary | ICD-10-CM | POA: Insufficient documentation

## 2022-03-28 DIAGNOSIS — R6520 Severe sepsis without septic shock: Secondary | ICD-10-CM | POA: Diagnosis not present

## 2022-03-28 DIAGNOSIS — R0602 Shortness of breath: Secondary | ICD-10-CM | POA: Insufficient documentation

## 2022-03-28 DIAGNOSIS — R5381 Other malaise: Secondary | ICD-10-CM | POA: Insufficient documentation

## 2022-03-28 DIAGNOSIS — Z87891 Personal history of nicotine dependence: Secondary | ICD-10-CM | POA: Insufficient documentation

## 2022-03-28 DIAGNOSIS — Z79899 Other long term (current) drug therapy: Secondary | ICD-10-CM | POA: Insufficient documentation

## 2022-03-28 DIAGNOSIS — J449 Chronic obstructive pulmonary disease, unspecified: Secondary | ICD-10-CM

## 2022-03-28 LAB — BASIC METABOLIC PANEL
Anion gap: 13 (ref 5–15)
BUN: 11 mg/dL (ref 8–23)
CO2: 23 mmol/L (ref 22–32)
Calcium: 8.5 mg/dL — ABNORMAL LOW (ref 8.9–10.3)
Chloride: 95 mmol/L — ABNORMAL LOW (ref 98–111)
Creatinine, Ser: 0.79 mg/dL (ref 0.61–1.24)
GFR, Estimated: >60 mL/min (ref >60)
Glucose, Bld: 97 mg/dL (ref 70–99)
Potassium: 3.6 mmol/L (ref 3.5–5.1)
Sodium: 131 mmol/L — ABNORMAL LOW (ref 135–145)

## 2022-03-28 LAB — CBC
Hemoglobin: 15.8 g/dL (ref 13.0–17.0)
Platelets: 158 10*3/uL (ref 150–400)
WBC: 8.7 10*3/uL (ref 4.0–10.5)

## 2022-03-28 LAB — BLOOD GAS, ARTERIAL
Acid-Base Excess: 5.1 mmol/L — ABNORMAL HIGH (ref 0.0–2.0)
Bicarbonate: 27.8 mmol/L (ref 20.0–28.0)
FIO2: 0.28 %
O2 Saturation: 98.1 %
Patient temperature: 37
pCO2 arterial: 34 mmHg (ref 32–48)
pH, Arterial: 7.52 — ABNORMAL HIGH (ref 7.35–7.45)
pO2, Arterial: 83 mmHg (ref 83–108)

## 2022-03-28 LAB — RESP PANEL BY RT-PCR (RSV, FLU A&B, COVID)  RVPGX2
Influenza A by PCR: NEGATIVE
Influenza B by PCR: NEGATIVE
Resp Syncytial Virus by PCR: NEGATIVE
SARS Coronavirus 2 by RT PCR: NEGATIVE

## 2022-03-28 LAB — TROPONIN I (HIGH SENSITIVITY)
Troponin I (High Sensitivity): 17 ng/L (ref <18)
Troponin I (High Sensitivity): 25 ng/L — ABNORMAL HIGH (ref <18)

## 2022-03-28 LAB — PROCALCITONIN: Procalcitonin: 0.23 ng/mL

## 2022-03-28 LAB — BRAIN NATRIURETIC PEPTIDE: B Natriuretic Peptide: 93 pg/mL (ref 0.0–100.0)

## 2022-03-28 MED ORDER — SODIUM CHLORIDE 0.9 % IV SOLN
1.0000 g | Freq: Once | INTRAVENOUS | Status: AC
  Administered 2022-03-28: 1 g via INTRAVENOUS
  Filled 2022-03-28: qty 10

## 2022-03-28 MED ORDER — ONDANSETRON HCL 4 MG/2ML IJ SOLN: 4.0000 mg | Freq: Four times a day (QID) | INTRAMUSCULAR | Status: DC | PRN

## 2022-03-28 MED ORDER — AMLODIPINE BESYLATE 5 MG PO TABS
5.0000 mg | ORAL_TABLET | Freq: Every day | ORAL | Status: DC
  Administered 2022-03-28: 5 mg via ORAL
  Filled 2022-03-28: qty 1

## 2022-03-28 MED ORDER — LACTATED RINGERS IV SOLN: INTRAVENOUS | Status: DC

## 2022-03-28 MED ORDER — ENOXAPARIN SODIUM 40 MG/0.4ML IJ SOSY
40.0000 mg | PREFILLED_SYRINGE | INTRAMUSCULAR | Status: DC
  Administered 2022-03-28: 40 mg via SUBCUTANEOUS
  Filled 2022-03-28: qty 0.4

## 2022-03-28 MED ORDER — ACETAMINOPHEN 325 MG PO TABS: 650.0000 mg | ORAL_TABLET | Freq: Four times a day (QID) | ORAL | Status: DC | PRN

## 2022-03-28 MED ORDER — AZITHROMYCIN 500 MG PO TABS
250.0000 mg | ORAL_TABLET | Freq: Every day | ORAL | Status: DC
  Administered 2022-03-29: 250 mg via ORAL
  Filled 2022-03-28: qty 1

## 2022-03-28 MED ORDER — SODIUM CHLORIDE 0.9 % IV SOLN
500.0000 mg | Freq: Once | INTRAVENOUS | Status: AC
  Administered 2022-03-28: 500 mg via INTRAVENOUS
  Filled 2022-03-28: qty 5

## 2022-03-28 MED ORDER — PREDNISONE 20 MG PO TABS
40.0000 mg | ORAL_TABLET | Freq: Every day | ORAL | Status: DC
  Administered 2022-03-29: 40 mg via ORAL
  Filled 2022-03-28: qty 2

## 2022-03-28 MED ORDER — SODIUM CHLORIDE 0.9 % IV SOLN
1.0000 g | INTRAVENOUS | Status: DC
  Filled 2022-03-28: qty 10

## 2022-03-28 MED ORDER — IOHEXOL 350 MG/ML SOLN
50.0000 mL | Freq: Once | INTRAVENOUS | Status: AC | PRN
  Administered 2022-03-28: 50 mL via INTRAVENOUS

## 2022-03-28 MED ORDER — SODIUM CHLORIDE 0.9% FLUSH
3.0000 mL | Freq: Two times a day (BID) | INTRAVENOUS | Status: DC
  Administered 2022-03-28 – 2022-03-29 (×3): 3 mL via INTRAVENOUS

## 2022-03-28 MED ORDER — UMECLIDINIUM-VILANTEROL 62.5-25 MCG/ACT IN AEPB
1.0000 | INHALATION_SPRAY | Freq: Every day | RESPIRATORY_TRACT | Status: DC
  Administered 2022-03-29: 1 via RESPIRATORY_TRACT
  Filled 2022-03-28: qty 14

## 2022-03-28 MED ORDER — METHYLPREDNISOLONE SODIUM SUCC 40 MG IJ SOLR
40.0000 mg | Freq: Every day | INTRAMUSCULAR | Status: AC
  Administered 2022-03-28: 40 mg via INTRAVENOUS
  Filled 2022-03-28: qty 1

## 2022-03-28 MED ORDER — IPRATROPIUM-ALBUTEROL 0.5-2.5 (3) MG/3ML IN SOLN
3.0000 mL | Freq: Four times a day (QID) | RESPIRATORY_TRACT | Status: DC
  Administered 2022-03-28 – 2022-03-29 (×4): 3 mL via RESPIRATORY_TRACT
  Filled 2022-03-28 (×4): qty 3

## 2022-03-28 NOTE — ED Provider Notes (Signed)
[facility] Provider Note   Event Date/Time   First MD Initiated Contact with Patient 03/28/22 1135     (approximate) History  Shortness of Breath  HPI David Clark is a 66 y.o. male with a past medical history of heart failure who presents for worsening shortness of breath over the last 2-3 days.  Patient also endorses 1 episode of nausea/vomiting this morning.  Patient's fianc works in our laboratory services division in the hospital and check his oxygen saturation last night and found it to be in the mid 80s.  She also noted patient to be warm to the touch but did not take his temperature. ROS: Patient currently denies any vision changes, tinnitus, difficulty speaking, facial droop, sore throat, abdominal pain, diarrhea, dysuria, or weakness/numbness/paresthesias in any extremity   Physical Exam  Triage Vital Signs: ED Triage Vitals  Enc Vitals Group     BP 03/28/22 0953 (!) 163/88     Pulse Rate 03/28/22 0953 (!) 132     Resp 03/28/22 0953 18     Temp 03/28/22 0953 99.1 F (37.3 C)     Temp Source 03/28/22 0953 Oral     SpO2 03/28/22 0953 95 %     Weight --      Height --      Head Circumference --      Peak Flow --      Pain Score 03/28/22 0954 0     Pain Loc --      Pain Edu? --      Excl. in [place]? --    Most recent vital signs: Vitals:   03/28/22 1300 03/28/22 1454  BP: (!) 166/96   Pulse: (!) 117   Resp: 16   Temp:  98.8 F (37.1 C)  SpO2: 96%    General: Awake, cooperative CV:  Good peripheral perfusion.  Resp:  Increased effort.  Dropped to 88% on room air and therefore placed on 2 L nasal cannula Abd:  No distention.  Other:  Elderly Caucasian male laying in bed in mild respiratory distress ED Results / Procedures / Treatments  Labs (all labs ordered are listed, but only abnormal results are displayed) Labs Reviewed  BASIC METABOLIC PANEL - Abnormal; Notable for the following components:      Result Value   Sodium 131 (*)     Chloride 95 (*)    Calcium 8.5 (*)    All other components within normal limits  BLOOD GAS, ARTERIAL - Abnormal; Notable for the following components:   pH, Arterial 7.52 (*)    Acid-Base Excess 5.1 (*)    All other components within normal limits  TROPONIN I (HIGH SENSITIVITY) - Abnormal; Notable for the following components:   Troponin I (High Sensitivity) 25 (*)    All other components within normal limits  RESP PANEL BY RT-PCR (RSV, FLU A&B, COVID)  [id number]  CULTURE, BLOOD (ROUTINE X 2)  CULTURE, BLOOD (ROUTINE X 2)  RESPIRATORY PANEL BY PCR  CBC  BRAIN NATRIURETIC PEPTIDE  PROCALCITONIN  LEGIONELLA PNEUMOPHILA SEROGP 1 UR AG  STREP PNEUMONIAE URINARY ANTIGEN  TROPONIN I (HIGH SENSITIVITY)   EKG ED ECG REPORT I, Naaman Plummer, the attending physician, personally viewed and interpreted this ECG. Date: 03/28/2022 EKG Time: 0950 Rate: 130 Rhythm: Tachycardic sinus rhythm QRS Axis: normal Intervals: normal ST/T Wave abnormalities: normal Narrative Interpretation: Tachycardic sinus rhythm.  No evidence of acute ischemia RADIOLOGY ED MD interpretation: 2 view chest x-ray interpreted independently  by me shows diffuse interstitial and lower lung airspace disease greater on the left than the right  CT angiography of the chest interpreted independently by me shows no evidence of pulmonary emboli with patchy areas of peribronchovascular opacity noted predominantly in the left upper lobe and left lower lobes consistent with multifocal infection -Agree with radiology assessment Official radiology report(s): CT Angio Chest PE W/Cm &/Or Wo Cm  Result Date: 03/28/2022 CLINICAL DATA:  Short of breath. Also with nausea vomiting this morning. EXAM: CT ANGIOGRAPHY CHEST WITH CONTRAST TECHNIQUE: Multidetector CT imaging of the chest was performed using the standard protocol during bolus administration of intravenous contrast. Multiplanar CT image reconstructions and MIPs were obtained to  evaluate the vascular anatomy. RADIATION DOSE REDUCTION: This exam was performed according to the departmental dose-optimization program which includes automated exposure control, adjustment of the mA and/or kV according to patient size and/or use of iterative reconstruction technique. CONTRAST:  94m OMNIPAQUE IOHEXOL 350 MG/ML SOLN COMPARISON:  Current chest radiograph. FINDINGS: Cardiovascular: Pulmonary arteries are well opacified. There is no evidence of a pulmonary embolism. Heart is normal in size. No pericardial effusion. Three-vessel coronary artery calcifications. Aorta is normal in caliber. No dissection. Mild atherosclerosis. Dilated right and left main pulmonary arteries, left 3.1 cm, right 2.9 cm. Mediastinum/Nodes: No enlarged mediastinal, hilar, or axillary lymph nodes. Thyroid gland, trachea, and esophagus demonstrate no significant findings. Lungs/Pleura: Advanced centrilobular emphysema. Irregular spiculated nodule in the right upper lobe, 1.4 x 1.0 cm, mean 1.2 cm, image 62, series 5. Small circumscribed nodule in the left upper lobe, 3 mm, image 42, series 5. Patchy peribronchovascular opacities are noted in the left upper and lower lobes, several with nodular configurations measuring up to 8 mm in size. Small areas of similar peribronchovascular opacity are noted inferior right upper lobe and right middle lobe. No pleural effusion.  No pneumothorax. Upper Abdomen: No acute findings. Musculoskeletal: No fracture or acute finding. No bone lesion. No chest wall mass. Review of the MIP images confirms the above findings. IMPRESSION: 1. No evidence of a pulmonary embolism. 2. Patchy areas of peribronchovascular opacity noted predominantly in the left upper and lower lobes consistent with multifocal infection. 3. Spiculated irregular nodule in the right upper lobe, 1.2 cm in size, suspicious for carcinoma. Consider one of the following in 3 months for both low-risk and high-risk individuals: (a)  repeat chest CT, (b) follow-up PET-CT, or (c) tissue sampling. This recommendation follows the consensus statement: Guidelines for Management of Incidental Pulmonary Nodules Detected on CT Images: From the Fleischner Society 2017; Radiology 2017; 284:228-243. 4. Advanced emphysema. Coronary artery calcifications and aortic atherosclerosis. 5. Enlarged right and left pulmonary arteries suggesting pulmonary artery hypertension. Aortic Atherosclerosis (ICD10-I70.0) and Emphysema (ICD10-J43.9). Electronically Signed   By: DLajean ManesM.D.   On: 03/28/2022 13:23   DG Chest 2 View  Result Date: 03/28/2022 CLINICAL DATA:  Shortness of breath EXAM: CHEST - 2 VIEW COMPARISON:  None Available. FINDINGS: The cardiomediastinal silhouette is within normal limits. There is diffuse interstitial prominence and lower lung airspace disease bilaterally, left greater than right. No large pleural effusion. No evidence of pneumothorax. No acute osseous abnormality. Thoracic spondylosis. IMPRESSION: Diffuse interstitial and lower lung airspace disease, left greater than right. Findings could reflect pulmonary edema or a multifocal infectious/inflammatory process. Electronically Signed   By: JMaurine SimmeringM.D.   On: 03/28/2022 10:28   PROCEDURES: Critical Care performed: Yes, see critical care procedure note(s) .1-3 Lead EKG Interpretation  Performed by: BValora Piccolo  K, MD Authorized by: Naaman Plummer, MD     Interpretation: abnormal     ECG rate:  111   ECG rate assessment: tachycardic     Rhythm: sinus tachycardia     Ectopy: none     Conduction: normal   CRITICAL CARE Performed by: Naaman Plummer  Total critical care time: 31 minutes  Critical care time was exclusive of separately billable procedures and treating other patients.  Critical care was necessary to treat or prevent imminent or life-threatening deterioration.  Critical care was time spent personally by me on the following activities: development  of treatment plan with patient and/or surrogate as well as nursing, discussions with consultants, evaluation of patient's response to treatment, examination of patient, obtaining history from patient or surrogate, ordering and performing treatments and interventions, ordering and review of laboratory studies, ordering and review of radiographic studies, pulse oximetry and re-evaluation of patient's condition.  MEDICATIONS ORDERED IN ED: Medications  azithromycin (ZITHROMAX) 500 mg in sodium chloride 0.9 % 250 mL IVPB (500 mg Intravenous New Bag/Given 03/28/22 1537)  enoxaparin (LOVENOX) injection 40 mg ( Subcutaneous Canceled Entry 03/28/22 1530)  sodium chloride flush (NS) 0.9 % injection 3 mL (has no administration in time range)  cefTRIAXone (ROCEPHIN) 1 g in sodium chloride 0.9 % 100 mL IVPB (has no administration in time range)  methylPREDNISolone sodium succinate (SOLU-MEDROL) 40 mg/mL injection 40 mg (has no administration in time range)    Followed by  predniSONE (DELTASONE) tablet 40 mg (has no administration in time range)  ipratropium-albuterol (DUONEB) 0.5-2.5 (3) MG/3ML nebulizer solution 3 mL (has no administration in time range)  umeclidinium-vilanterol (ANORO ELLIPTA) 62.5-25 MCG/ACT 1 puff (has no administration in time range)  azithromycin (ZITHROMAX) tablet 250 mg (has no administration in time range)  amLODipine (NORVASC) tablet 5 mg (has no administration in time range)  lactated ringers infusion (has no administration in time range)  iohexol (OMNIPAQUE) 350 MG/ML injection 50 mL (50 mLs Intravenous Contrast Given 03/28/22 1306)  cefTRIAXone (ROCEPHIN) 1 g in sodium chloride 0.9 % 100 mL IVPB (1 g Intravenous New Bag/Given 03/28/22 1416)   IMPRESSION / MDM / ASSESSMENT AND PLAN / ED COURSE  I reviewed the triage vital signs and the nursing notes.                             The patient is on the cardiac monitor to evaluate for evidence of arrhythmia and/or significant  heart rate changes. Patient's presentation is most consistent with acute presentation with potential threat to life or bodily function. Presents with shortness of breath, cough, and malaise concerning for pneumonia.  DDx: PE, COPD exacerbation, Pneumothorax, TB, Atypical ACS, Esophageal Rupture, Toxic Exposure, Foreign Body Airway Obstruction.  Workup: CXR CBC, CMP, lactate, troponin  Given History, Exam, and Workup presentation most consistent with pneumonia.  Findings: Bilateral multifocal opacities concerning for multifocal pneumonia  Tx: Ceftriaxone 1g IV Azithromycin '500mg'$  IV  Reassessment: As patient is continuing to require supplemental oxygenation for acute hypoxic respiratory failure, patient will require admission to the internal medicine service for further evaluation and management  Disposition: Admit   FINAL CLINICAL IMPRESSION(S) / ED DIAGNOSES   Final diagnoses:  Community acquired pneumonia, unspecified laterality  Acute respiratory failure with hypoxia ([place])  Sepsis with acute hypoxic respiratory failure without septic shock, due to unspecified organism [facility])   Rx / DC Orders   ED Discharge Orders  None      Note:  This document was prepared using Dragon voice recognition software and may include unintentional dictation errors.   Naaman Plummer, MD 03/28/22 [phone]

## 2022-03-28 NOTE — Assessment & Plan Note (Addendum)
Spiculated nodule right upper lobe 1.2 cm.  Follow-up with PCP and consider PET CT scan as outpatient.

## 2022-03-28 NOTE — Assessment & Plan Note (Addendum)
Patient was given Solu-Medrol.  Switched over to prednisone.  I will prescribe prednisone for few more days.  Albuterol and Anoro inhaler prescribed.

## 2022-03-28 NOTE — Assessment & Plan Note (Signed)
Continue Norvasc

## 2022-03-28 NOTE — ED Notes (Signed)
Male purewick applied.

## 2022-03-28 NOTE — Assessment & Plan Note (Addendum)
Patient presenting with several day history of shortness of breath with productive cough.  CTA notable for patchy peribronchovascular opacities predominantly in the left upper and lower lobes also present on the right consistent with multifocal pneumonia.  - Continue supplemental oxygen to maintain oxygen saturation above 90% - Wean as tolerated - Continue Azithromycin and Rocephin for now - Procalcitonin pending - RVP pending - Legionella and strep pneumo urinary antigens pending - COPD exacerbation treatment as detailed below

## 2022-03-28 NOTE — Assessment & Plan Note (Signed)
In the setting of acute illness.  No suspicion for sepsis at this time.  -Telemetry monitoring overnight to ensure improvement

## 2022-03-28 NOTE — H&P (Addendum)
History and Physical    Patient: David Clark DOB: 1956/01/20 DOA: 03/28/2022 DOS: the patient was seen and examined on 03/28/2022 PCP: Rusty Aus, MD  Patient coming from: Home  Chief Complaint:  Chief Complaint  Patient presents with   Shortness of Breath   HPI: David Clark is a 66 y.o. male with medical history significant of hypertension, hyperlipidemia, hepatitis B, GERD who presents to the ED with complaints of shortness of breath.  David Clark states that for the last 3 to 4 days, David Clark has been experiencing gradually worsening shortness of breath and productive cough with thick yellow sputum.  In addition, David Clark has been experiencing fever, chills and today David Clark developed nausea with nonbloody vomiting.  David Clark denies any chest pain or palpitations.  David Clark states that David Clark has been smoking 3 to 4 cigars/day.  David Clark has noticed that over the last several months, David Clark has dyspnea on exertion and wheezing.  David Clark has not sought evaluation for this with his PCP.  ED course: On arrival to the ED, patient was hypertensive at 163/88 with heart rate of 132.  David Clark was saturating at 95% on 2 L of nasal cannula.  Lab work notable for ABG with pH of 7.5, sodium of 131, negative COVID-19, influenza and RSV.  WBC within normal limits.  CTA was obtained that demonstrated multifocal pneumonia but no evidence of PE.  Patient was started on ceftriaxone and azithromycin.  TRH contacted for admission.  Review of Systems: As mentioned in the history of present illness. All other systems reviewed and are negative. Past Medical History:  Diagnosis Date   Arthritis    GERD (gastroesophageal reflux disease)    Hepatitis B 1993   Hypertension    Squamous cell carcinoma, arm, left    Past Surgical History:  Procedure Laterality Date   ANTERIOR CERVICAL DECOMP/DISCECTOMY FUSION N/A 01/01/2021   Procedure: C5-6 ANTERIOR CERVICAL DECOMPRESSION/DISCECTOMY FUSION 1 LEVEL;  Surgeon: Meade Maw,  MD;  Location: [facility] ORS;  Service: Neurosurgery;  Laterality: N/A;   NO PAST SURGERIES     Social History:  reports that David Clark quit smoking about 31 years ago. His smoking use included cigarettes. His smokeless tobacco use includes snuff. David Clark reports current alcohol use. David Clark reports that David Clark does not currently use drugs.  No Known Allergies  Family History  Problem Relation Age of Onset   Diabetes type II Mother    COPD Mother    Dementia Father    Prostate cancer Father     Prior to Admission medications   Medication Sig Start Date End Date Taking? Authorizing Provider  amLODipine (NORVASC) 5 MG tablet Take 5 mg by mouth at bedtime. 11/18/20   [provider]  diclofenac Sodium (VOLTAREN) 1 % GEL Apply 2 g topically daily as needed for pain.    [provider]  gabapentin (NEURONTIN) 300 MG capsule Take 300 mg by mouth at bedtime. 12/06/20   [provider]  HYDROcodone-acetaminophen (NORCO/VICODIN) 5-325 MG tablet Take 2 tablets by mouth in the morning and at bedtime. 12/09/20   [provider]  methocarbamol (ROBAXIN) 500 MG tablet Take 1 tablet (500 mg total) by mouth 4 (four) times daily. 01/01/21   Loleta Dicker, PA  olmesartan-hydrochlorothiazide (BENICAR HCT) 20-12.5 MG tablet Take 1 tablet by mouth daily. 12/05/20   [provider]  omeprazole (PRILOSEC) 20 MG capsule Take 20 mg by mouth daily. 11/18/20   [provider]  senna (SENOKOT) 8.6 MG  TABS tablet Take 1 tablet (8.6 mg total) by mouth daily as needed for mild constipation. 01/01/21   Loleta Dicker, PA  vitamin B-12 (CYANOCOBALAMIN) 1000 MCG tablet Take 1,000 mcg by mouth daily.    [provider]    Physical Exam: Vitals:   03/28/22 0953 03/28/22 1200 03/28/22 1230 03/28/22 1300  BP: (!) 163/88 132/85 124/85 (!) 166/96  Pulse: (!) 132 (!) 110 (!) 114 (!) 117  Resp: 18 (!) '23 17 16  '$ Temp: 99.1 F (37.3 C)     TempSrc: Oral     SpO2: 95% 94% (!) 88% 96%    Physical Exam Vitals and nursing note reviewed.  Constitutional:      Appearance: David Clark is overweight. David Clark is ill-appearing.  HENT:     Head: Normocephalic and atraumatic.  Eyes:     Pupils: Pupils are equal, round, and reactive to light.  Cardiovascular:     Rate and Rhythm: Regular rhythm. Tachycardia present.     Pulses:          Dorsalis pedis pulses are 2+ on the right side and 2+ on the left side.     Heart sounds: No murmur heard.    No gallop.  Pulmonary:     Effort: Tachypnea and respiratory distress present.     Breath sounds: Decreased breath sounds (Decreased breath sounds early in the middle to lower lung fields.) and wheezing (Diffuse expiratory wheezing heard in all lung fields) present. No rhonchi or rales.  Abdominal:     General: Bowel sounds are normal. There is no distension.     Palpations: Abdomen is soft.     Tenderness: There is no abdominal tenderness.  Musculoskeletal:     Cervical back: Neck supple.     Comments: Trace bilateral pitting edema.  Skin:    General: Skin is warm and dry.  Neurological:     General: No focal deficit present.     Mental Status: David Clark is alert and oriented to person, place, and time.  Psychiatric:        Mood and Affect: Mood normal.        Behavior: Behavior normal.    Data Reviewed: CBC with WBC of 8.7, hemoglobin of 15.8, platelets of 158. BMP with sodium of 131, potassium of 3.6, bicarb 23, creatinine of 0.79 and anion gap 13. BNP 93 Troponin initially 17 with flat trend to 25 ABG with pH of 7.5, pCO2 of 34 and pO2 of 83.  EKG personally reviewed.  Sinus rhythm with a rate of 130 consistent with sinus tachycardia.  Some repolarization changes within the precordial leads.  No ST or T wave changes concerning for acute ischemia.  CT Angio Chest PE W/Cm &/Or Wo Cm  Result Date: 03/28/2022 CLINICAL DATA:  Short of breath. Also with nausea vomiting this morning. EXAM: CT ANGIOGRAPHY CHEST WITH CONTRAST TECHNIQUE:  Multidetector CT imaging of the chest was performed using the standard protocol during bolus administration of intravenous contrast. Multiplanar CT image reconstructions and MIPs were obtained to evaluate the vascular anatomy. RADIATION DOSE REDUCTION: This exam was performed according to the departmental dose-optimization program which includes automated exposure control, adjustment of the mA and/or kV according to patient size and/or use of iterative reconstruction technique. CONTRAST:  62m OMNIPAQUE IOHEXOL 350 MG/ML SOLN COMPARISON:  Current chest radiograph. FINDINGS: Cardiovascular: Pulmonary arteries are well opacified. There is no evidence of a pulmonary embolism. Heart is normal in size. No pericardial effusion. Three-vessel coronary artery calcifications. Aorta  is normal in caliber. No dissection. Mild atherosclerosis. Dilated right and left main pulmonary arteries, left 3.1 cm, right 2.9 cm. Mediastinum/Nodes: No enlarged mediastinal, hilar, or axillary lymph nodes. Thyroid gland, trachea, and esophagus demonstrate no significant findings. Lungs/Pleura: Advanced centrilobular emphysema. Irregular spiculated nodule in the right upper lobe, 1.4 x 1.0 cm, mean 1.2 cm, image 62, series 5. Small circumscribed nodule in the left upper lobe, 3 mm, image 42, series 5. Patchy peribronchovascular opacities are noted in the left upper and lower lobes, several with nodular configurations measuring up to 8 mm in size. Small areas of similar peribronchovascular opacity are noted inferior right upper lobe and right middle lobe. No pleural effusion.  No pneumothorax. Upper Abdomen: No acute findings. Musculoskeletal: No fracture or acute finding. No bone lesion. No chest wall mass. Review of the MIP images confirms the above findings. IMPRESSION: 1. No evidence of a pulmonary embolism. 2. Patchy areas of peribronchovascular opacity noted predominantly in the left upper and lower lobes consistent with multifocal  infection. 3. Spiculated irregular nodule in the right upper lobe, 1.2 cm in size, suspicious for carcinoma. Consider one of the following in 3 months for both low-risk and high-risk individuals: (a) repeat chest CT, (b) follow-up PET-CT, or (c) tissue sampling. This recommendation follows the consensus statement: Guidelines for Management of Incidental Pulmonary Nodules Detected on CT Images: From the Fleischner Society 2017; Radiology 2017; 284:228-243. 4. Advanced emphysema. Coronary artery calcifications and aortic atherosclerosis. 5. Enlarged right and left pulmonary arteries suggesting pulmonary artery hypertension. Aortic Atherosclerosis (ICD10-I70.0) and Emphysema (ICD10-J43.9). Electronically Signed   By: Lajean Manes M.D.   On: 03/28/2022 13:23   DG Chest 2 View  Result Date: 03/28/2022 CLINICAL DATA:  Shortness of breath EXAM: CHEST - 2 VIEW COMPARISON:  None Available. FINDINGS: The cardiomediastinal silhouette is within normal limits. There is diffuse interstitial prominence and lower lung airspace disease bilaterally, left greater than right. No large pleural effusion. No evidence of pneumothorax. No acute osseous abnormality. Thoracic spondylosis. IMPRESSION: Diffuse interstitial and lower lung airspace disease, left greater than right. Findings could reflect pulmonary edema or a multifocal infectious/inflammatory process. Electronically Signed   By: Maurine Simmering M.D.   On: 03/28/2022 10:28    There are no new results to review at this time.  Assessment and Plan: * CAP (community acquired pneumonia) Patient presenting with several day history of shortness of breath with productive cough.  CTA notable for patchy peribronchovascular opacities predominantly in the left upper and lower lobes also present on the right consistent with multifocal pneumonia.  - Continue supplemental oxygen to maintain oxygen saturation above 90% - Wean as tolerated - Continue Azithromycin and Rocephin for  now - Procalcitonin pending - RVP pending - Legionella and strep pneumo urinary antigens pending - COPD exacerbation treatment as detailed below  COPD with acute exacerbation (HCC) CT imaging with evidence of advanced emphysema in the setting of ongoing tobacco use with months long symptoms of dyspnea on exertion and wheezing. Consistent with COPD, however patient will need formal PFTs 4-6 weeks after recovery for further characterization.   - DuoNebs every 6 hours - Solu-Medrol one-time dose followed by Prednisone 40 mg daily - Start LAMA-LABA combination tomorrow - RT consultation for teaching on how to use inhalers - Formal PFTs in 4 to 6 weeks  Sinus tachycardia In the setting of acute illness.  No suspicion for sepsis at this time.  -Telemetry monitoring overnight to ensure improvement  Pulmonary nodule CTA notable for small  spiculated irregular nodule in the right upper lobe measuring 1.4 x 1.0 cm.  Given prior history of smoking, will need close monitoring.   - Recommend outpatient referral to pulmonology - Repeat CT in 3 months  Benign essential hypertension - Continue home antihypertensives  Advance Care Planning:   Code Status: Full Code. Verified with patient.   Consults: None  Family Communication: Patient's significant other updated at bedside.   Severity of Illness: The appropriate patient status for this patient is OBSERVATION. Observation status is judged to be reasonable and necessary in order to provide the required intensity of service to ensure the patient's safety. The patient's presenting symptoms, physical exam findings, and initial radiographic and laboratory data in the context of their medical condition is felt to place them at decreased risk for further clinical deterioration. Furthermore, it is anticipated that the patient will be medically stable for discharge from the hospital within 2 midnights of admission.   Author: Jose Persia,  MD 03/28/2022 2:51 PM  For on call review [email or web address].

## 2022-03-28 NOTE — ED Triage Notes (Signed)
Pt to ED via POV from home. Pt reports SOB x2-3 days. Pt also states N/V this morning. Pt's fiance states o2 sat was mid 80s last night and pt felt warm but did not have access to thermometer

## 2022-03-29 DIAGNOSIS — R000 Tachycardia, unspecified: Secondary | ICD-10-CM | POA: Diagnosis not present

## 2022-03-29 DIAGNOSIS — J189 Pneumonia, unspecified organism: Secondary | ICD-10-CM | POA: Diagnosis not present

## 2022-03-29 DIAGNOSIS — J441 Chronic obstructive pulmonary disease with (acute) exacerbation: Secondary | ICD-10-CM | POA: Diagnosis not present

## 2022-03-29 DIAGNOSIS — N400 Enlarged prostate without lower urinary tract symptoms: Secondary | ICD-10-CM | POA: Diagnosis not present

## 2022-03-29 DIAGNOSIS — R911 Solitary pulmonary nodule: Secondary | ICD-10-CM | POA: Diagnosis not present

## 2022-03-29 DIAGNOSIS — I10 Essential (primary) hypertension: Secondary | ICD-10-CM | POA: Diagnosis not present

## 2022-03-29 LAB — BASIC METABOLIC PANEL
Anion gap: 9 (ref 5–15)
BUN: 13 mg/dL (ref 8–23)
CO2: 28 mmol/L (ref 22–32)
Calcium: 9.2 mg/dL (ref 8.9–10.3)
Chloride: 104 mmol/L (ref 98–111)
Creatinine, Ser: 0.75 mg/dL (ref 0.61–1.24)
GFR, Estimated: >60 mL/min (ref >60)
Glucose, Bld: 150 mg/dL — ABNORMAL HIGH (ref 70–99)
Potassium: 3.6 mmol/L (ref 3.5–5.1)
Sodium: 141 mmol/L (ref 135–145)

## 2022-03-29 LAB — CBC WITH DIFFERENTIAL/PLATELET
Abs Immature Granulocytes: 0.08 10*3/uL — ABNORMAL HIGH (ref 0.00–0.07)
Basophils Absolute: 0 10*3/uL (ref 0.0–0.1)
Basophils Relative: 0 %
Eosinophils Absolute: 0 10*3/uL (ref 0.0–0.5)
Eosinophils Relative: 0 %
HCT: 38.9 % — ABNORMAL LOW (ref 39.0–52.0)
Hemoglobin: 14.5 g/dL (ref 13.0–17.0)
Immature Granulocytes: 1 %
Lymphocytes Relative: 7 %
Lymphs Abs: 0.6 10*3/uL — ABNORMAL LOW (ref 0.7–4.0)
MCH: 35.5 pg — ABNORMAL HIGH (ref 26.0–34.0)
MCHC: 37.3 g/dL — ABNORMAL HIGH (ref 30.0–36.0)
MCV: 95.1 fL (ref 80.0–100.0)
Monocytes Absolute: 0.4 10*3/uL (ref 0.1–1.0)
Monocytes Relative: 4 %
Neutro Abs: 7.7 10*3/uL (ref 1.7–7.7)
Neutrophils Relative %: 88 %
Platelets: 164 10*3/uL (ref 150–400)
RBC: 4.09 MIL/uL — ABNORMAL LOW (ref 4.22–5.81)
RDW: 12.1 % (ref 11.5–15.5)
WBC: 8.8 10*3/uL (ref 4.0–10.5)
nRBC: 0 % (ref 0.0–0.2)

## 2022-03-29 LAB — RESPIRATORY PANEL BY PCR

## 2022-03-29 LAB — HIV ANTIBODY (ROUTINE TESTING W REFLEX): HIV Screen 4th Generation wRfx: NONREACTIVE

## 2022-03-29 LAB — STREP PNEUMONIAE URINARY ANTIGEN: Strep Pneumo Urinary Antigen: NEGATIVE

## 2022-03-29 MED ORDER — UMECLIDINIUM-VILANTEROL 62.5-25 MCG/ACT IN AEPB: 1.0000 | INHALATION_SPRAY | Freq: Every day | RESPIRATORY_TRACT | 0 refills | Status: DC

## 2022-03-29 MED ORDER — ALBUTEROL SULFATE HFA 108 (90 BASE) MCG/ACT IN AERS: 2.0000 | INHALATION_SPRAY | Freq: Four times a day (QID) | RESPIRATORY_TRACT | 0 refills | Status: DC | PRN

## 2022-03-29 MED ORDER — AZITHROMYCIN 250 MG PO TABS: 250.0000 mg | ORAL_TABLET | Freq: Every day | ORAL | 0 refills | Status: AC

## 2022-03-29 MED ORDER — CEFDINIR 300 MG PO CAPS: 300.0000 mg | ORAL_CAPSULE | Freq: Two times a day (BID) | ORAL | 0 refills | Status: AC

## 2022-03-29 MED ORDER — CEFDINIR 300 MG PO CAPS
300.0000 mg | ORAL_CAPSULE | Freq: Two times a day (BID) | ORAL | Status: DC
  Administered 2022-03-29: 300 mg via ORAL
  Filled 2022-03-29: qty 1

## 2022-03-29 MED ORDER — PREDNISONE 20 MG PO TABS: 40.0000 mg | ORAL_TABLET | Freq: Every day | ORAL | 0 refills | Status: AC

## 2022-03-29 NOTE — ED Notes (Signed)
Assumed care from [place], [place]. Pt resting comfortably in bed at this time. Call light with in reach.

## 2022-03-29 NOTE — Discharge Summary (Signed)
Physician Discharge Summary   Patient: David Clark MRN: [id number] DOB: [birth year]  Admit date:     03/28/2022  Discharge date: 03/29/22  Discharge Physician: Loletha Grayer   PCP: Rusty Aus, MD   Recommendations at discharge:   Follow-up PCP 5 days  Discharge Diagnoses: Principal Problem:   CAP (community acquired pneumonia) Active Problems:   COPD with acute exacerbation (HCC)   Sinus tachycardia   Pulmonary nodule   Benign essential hypertension   BPH (benign prostatic hyperplasia)    Hospital Course: 66 year old man with past medical history of arthritis, GERD, hepatitis B, hypertension and squamous cell skin cancer.  He presents to the hospital with shortness of breath gradually worsening over the last 3 to 4 days.  CT scan of the chest negative for pulmonary embolism, patchy areas of opacities left upper lobe and left lower lobe, spiculated nodule right upper lobe.  Viral respiratory panel was sent off and was positive for coronavirus OC 43 (this is not COVID-19).  Patient was feeling better and was discharged home in stable condition on 03/29/2022.  Pulmonary nodule seen on CT scan of the chest.  Will need to follow-up as outpatient.  Follow-up with PMD and consider PET CT scan as outpatient.  Assessment and Plan: * CAP (community acquired pneumonia) I suspect this is viral pneumonia with coronavirus OC 43 being positive.  The patient was given Rocephin and Zithromax.  I will prescribe Omnicef and Zithromax upon going home to complete a 5-day course since procalcitonin was a little borderline at 0.23.  COPD with acute exacerbation [facility]) Patient was given Solu-Medrol.  Switched over to prednisone.  I will prescribe prednisone for few more days.  Albuterol and Anoro inhaler prescribed.  Sinus tachycardia Improved  Pulmonary nodule Spiculated nodule right upper lobe 1.2 cm.  Follow-up with PCP and consider PET CT scan as outpatient.  Benign essential  hypertension Continue Norvasc  BPH (benign prostatic hyperplasia) On Flomax         Consultants: None Procedures performed: None Disposition: Home Diet recommendation:  Cardiac diet DISCHARGE MEDICATION: Allergies as of 03/29/2022   No Known Allergies      Medication List     TAKE these medications    albuterol 108 (90 Base) MCG/ACT inhaler Commonly known as: VENTOLIN HFA Inhale 2 puffs into the lungs every 6 (six) hours as needed for wheezing or shortness of breath.   amLODipine 5 MG tablet Commonly known as: NORVASC Take 5 mg by mouth at bedtime.   azithromycin 250 MG tablet Commonly known as: ZITHROMAX Take 1 tablet (250 mg total) by mouth daily for 3 days. Start taking on: March 30, 2022   cefdinir 300 MG capsule Commonly known as: OMNICEF Take 1 capsule (300 mg total) by mouth every 12 (twelve) hours for 7 doses.   colchicine 0.6 MG tablet Take 0.6 mg by mouth daily as needed (GOUT FLARE).   omeprazole 20 MG capsule Commonly known as: PRILOSEC Take 20 mg by mouth daily.   predniSONE 20 MG tablet Commonly known as: DELTASONE Take 2 tablets (40 mg total) by mouth daily with breakfast for 3 days. Start taking on: March 30, 2022   tamsulosin 0.4 MG Caps capsule Commonly known as: FLOMAX Take 0.4 mg by mouth daily.   umeclidinium-vilanterol 62.5-25 MCG/ACT Aepb Commonly known as: ANORO ELLIPTA Inhale 1 puff into the lungs daily.        Follow-up Information     Rusty Aus, MD Follow up in 5  day(s).   Specialty: Internal Medicine Contact information: [place] [address] [phone]                Discharge Exam: Filed Weights   03/28/22 1500 03/29/22 0432  Weight: 86.7 kg 85.3 kg   Physical Exam HENT:     Head: Normocephalic.     Mouth/Throat:     Pharynx: No oropharyngeal exudate.  Eyes:     General: Lids are normal.     Conjunctiva/sclera:  Conjunctivae normal.  Cardiovascular:     Rate and Rhythm: Normal rate and regular rhythm.     Heart sounds: Normal heart sounds, S1 normal and S2 normal.  Pulmonary:     Breath sounds: No decreased breath sounds, wheezing, rhonchi or rales.  Abdominal:     Palpations: Abdomen is soft.     Tenderness: There is no abdominal tenderness.  Musculoskeletal:     Right lower leg: No swelling.     Left lower leg: No swelling.  Skin:    General: Skin is warm.     Findings: No rash.  Neurological:     Mental Status: He is alert and oriented to person, place, and time.      Condition at discharge: stable  The results of significant diagnostics from this hospitalization (including imaging, microbiology, ancillary and laboratory) are listed below for reference.   Imaging Studies: CT Angio Chest PE W/Cm &/Or Wo Cm  Result Date: 03/28/2022 CLINICAL DATA:  Short of breath. Also with nausea vomiting this morning. EXAM: CT ANGIOGRAPHY CHEST WITH CONTRAST TECHNIQUE: Multidetector CT imaging of the chest was performed using the standard protocol during bolus administration of intravenous contrast. Multiplanar CT image reconstructions and MIPs were obtained to evaluate the vascular anatomy. RADIATION DOSE REDUCTION: This exam was performed according to the departmental dose-optimization program which includes automated exposure control, adjustment of the mA and/or kV according to patient size and/or use of iterative reconstruction technique. CONTRAST:  48m OMNIPAQUE IOHEXOL 350 MG/ML SOLN COMPARISON:  Current chest radiograph. FINDINGS: Cardiovascular: Pulmonary arteries are well opacified. There is no evidence of a pulmonary embolism. Heart is normal in size. No pericardial effusion. Three-vessel coronary artery calcifications. Aorta is normal in caliber. No dissection. Mild atherosclerosis. Dilated right and left main pulmonary arteries, left 3.1 cm, right 2.9 cm. Mediastinum/Nodes: No enlarged  mediastinal, hilar, or axillary lymph nodes. Thyroid gland, trachea, and esophagus demonstrate no significant findings. Lungs/Pleura: Advanced centrilobular emphysema. Irregular spiculated nodule in the right upper lobe, 1.4 x 1.0 cm, mean 1.2 cm, image 62, series 5. Small circumscribed nodule in the left upper lobe, 3 mm, image 42, series 5. Patchy peribronchovascular opacities are noted in the left upper and lower lobes, several with nodular configurations measuring up to 8 mm in size. Small areas of similar peribronchovascular opacity are noted inferior right upper lobe and right middle lobe. No pleural effusion.  No pneumothorax. Upper Abdomen: No acute findings. Musculoskeletal: No fracture or acute finding. No bone lesion. No chest wall mass. Review of the MIP images confirms the above findings. IMPRESSION: 1. No evidence of a pulmonary embolism. 2. Patchy areas of peribronchovascular opacity noted predominantly in the left upper and lower lobes consistent with multifocal infection. 3. Spiculated irregular nodule in the right upper lobe, 1.2 cm in size, suspicious for carcinoma. Consider one of the following in 3 months for both low-risk and high-risk individuals: (a) repeat chest CT, (b) follow-up PET-CT, or (c) tissue sampling. This recommendation  follows the consensus statement: Guidelines for Management of Incidental Pulmonary Nodules Detected on CT Images: From the Fleischner Society 2017; Radiology 2017; 284:228-243. 4. Advanced emphysema. Coronary artery calcifications and aortic atherosclerosis. 5. Enlarged right and left pulmonary arteries suggesting pulmonary artery hypertension. Aortic Atherosclerosis (ICD10-I70.0) and Emphysema (ICD10-J43.9). Electronically Signed   By: Lajean Manes M.D.   On: 03/28/2022 13:23   DG Chest 2 View  Result Date: 03/28/2022 CLINICAL DATA:  Shortness of breath EXAM: CHEST - 2 VIEW COMPARISON:  None Available. FINDINGS: The cardiomediastinal silhouette is within  normal limits. There is diffuse interstitial prominence and lower lung airspace disease bilaterally, left greater than right. No large pleural effusion. No evidence of pneumothorax. No acute osseous abnormality. Thoracic spondylosis. IMPRESSION: Diffuse interstitial and lower lung airspace disease, left greater than right. Findings could reflect pulmonary edema or a multifocal infectious/inflammatory process. Electronically Signed   By: Maurine Simmering M.D.   On: 03/28/2022 10:28    Microbiology: Results for orders placed or performed during the hospital encounter of 03/28/22  Resp panel by RT-PCR (RSV, Flu A&B, Covid) Anterior Nasal Swab     Status: None   Collection Time: 03/28/22 12:04 PM   Specimen: Anterior Nasal Swab  Result Value Ref Range Status   SARS Coronavirus 2 by RT PCR NEGATIVE NEGATIVE Final    Comment: (NOTE) SARS-CoV-2 target nucleic acids are NOT DETECTED.  The SARS-CoV-2 RNA is generally detectable in upper respiratory specimens during the acute phase of infection. The lowest concentration of SARS-CoV-2 viral copies this assay can detect is 138 copies/mL. A negative result does not preclude SARS-Cov-2 infection and should not be used as the sole basis for treatment or other patient management decisions. A negative result may occur with  improper specimen collection/handling, submission of specimen other than nasopharyngeal swab, presence of viral mutation(s) within the areas targeted by this assay, and inadequate number of viral copies(<138 copies/mL). A negative result must be combined with clinical observations, patient history, and epidemiological information. The expected result is Negative.  Fact Sheet for Patients:  EntrepreneurPulse.com.au  Fact Sheet for Healthcare Providers:  IncredibleEmployment.be  This test is no t yet approved or cleared by the [place] FDA and  has been authorized for detection and/or diagnosis of  SARS-CoV-2 by FDA under an Emergency Use Authorization (EUA). This EUA will remain  in effect (meaning this test can be used) for the duration of the COVID-19 declaration under Section 564(b)(1) of the Act, 21 U.S.C.section 360bbb-3(b)(1), unless the authorization is terminated  or revoked sooner.       Influenza A by PCR NEGATIVE NEGATIVE Final   Influenza B by PCR NEGATIVE NEGATIVE Final    Comment: (NOTE) The Xpert Xpress SARS-CoV-2/FLU/RSV plus assay is intended as an aid in the diagnosis of influenza from Nasopharyngeal swab specimens and should not be used as a sole basis for treatment. Nasal washings and aspirates are unacceptable for Xpert Xpress SARS-CoV-2/FLU/RSV testing.  Fact Sheet for Patients: EntrepreneurPulse.com.au  Fact Sheet for Healthcare Providers: IncredibleEmployment.be  This test is not yet approved or cleared by the [place] FDA and has been authorized for detection and/or diagnosis of SARS-CoV-2 by FDA under an Emergency Use Authorization (EUA). This EUA will remain in effect (meaning this test can be used) for the duration of the COVID-19 declaration under Section 564(b)(1) of the Act, 21 U.S.C. section 360bbb-3(b)(1), unless the authorization is terminated or revoked.     Resp Syncytial Virus by PCR NEGATIVE NEGATIVE Final  Comment: (NOTE) Fact Sheet for Patients: EntrepreneurPulse.com.au  Fact Sheet for Healthcare Providers: IncredibleEmployment.be  This test is not yet approved or cleared by the [place] FDA and has been authorized for detection and/or diagnosis of SARS-CoV-2 by FDA under an Emergency Use Authorization (EUA). This EUA will remain in effect (meaning this test can be used) for the duration of the COVID-19 declaration under Section 564(b)(1) of the Act, 21 U.S.C. section 360bbb-3(b)(1), unless the authorization is terminated  or revoked.  Performed at [facility], [address] [place], [place] [address]   Culture, blood (routine x 2)     Status: None (Preliminary result)   Collection Time: 03/28/22 12:04 PM   Specimen: BLOOD  Result Value Ref Range Status   Specimen Description BLOOD LEFT ANTECUBITAL  Final   Special Requests   Final    BOTTLES DRAWN AEROBIC AND ANAEROBIC Blood Culture adequate volume   Culture   Final    NO GROWTH < 24 HOURS Performed at [facility], [address] [place], [place] [address]    Report Status PENDING  Incomplete  Respiratory (~20 pathogens) panel by PCR     Status: Abnormal   Collection Time: 03/28/22  3:43 PM   Specimen: Nasopharyngeal Swab; Respiratory  Result Value Ref Range Status   Adenovirus NOT DETECTED NOT DETECTED Final   Coronavirus 229E NOT DETECTED NOT DETECTED Final    Comment: (NOTE) The Coronavirus on the Respiratory Panel, DOES NOT test for the novel  Coronavirus (2019 nCoV)    Coronavirus HKU1 NOT DETECTED NOT DETECTED Final   Coronavirus NL63 NOT DETECTED NOT DETECTED Final   Coronavirus OC43 DETECTED (A) NOT DETECTED Final   Metapneumovirus NOT DETECTED NOT DETECTED Final   Rhinovirus / Enterovirus NOT DETECTED NOT DETECTED Final   Influenza A NOT DETECTED NOT DETECTED Final   Influenza B NOT DETECTED NOT DETECTED Final   Parainfluenza Virus 1 NOT DETECTED NOT DETECTED Final   Parainfluenza Virus 2 NOT DETECTED NOT DETECTED Final   Parainfluenza Virus 3 NOT DETECTED NOT DETECTED Final   Parainfluenza Virus 4 NOT DETECTED NOT DETECTED Final   Respiratory Syncytial Virus NOT DETECTED NOT DETECTED Final   Bordetella pertussis NOT DETECTED NOT DETECTED Final   Bordetella Parapertussis NOT DETECTED NOT DETECTED Final   Chlamydophila pneumoniae NOT DETECTED NOT DETECTED Final   Mycoplasma pneumoniae NOT DETECTED NOT DETECTED Final    Comment: Performed at [facility], [place] [address] [place], [place] [address]   Culture, blood (routine x 2)     Status: None (Preliminary result)   Collection Time: 03/28/22  6:46 PM   Specimen: BLOOD  Result Value Ref Range Status   Specimen Description BLOOD BLOOD LEFT HAND  Final   Special Requests   Final    BOTTLES DRAWN AEROBIC AND ANAEROBIC Blood Culture results may not be optimal due to an inadequate volume of blood received in culture bottles   Culture   Final    NO GROWTH < 24 HOURS Performed at [facility], [address] [place],  [address]    Report Status PENDING  Incomplete    Labs: CBC: Recent Labs  Lab 03/28/22 0958 03/29/22 0554  WBC 8.7 8.8  NEUTROABS  --  7.7  HGB 15.8 14.5  HCT RESULTS UNAVAILABLE DUE TO INTERFERING SUBSTANCE 38.9*  MCV RESULTS UNAVAILABLE DUE TO INTERFERING SUBSTANCE 95.1  PLT 158 494   Basic Metabolic Panel: Recent Labs  Lab 03/28/22 0958 03/29/22 0554  NA 131* 141  K 3.6 3.6  CL 95* 104  CO2 23 28  GLUCOSE 97 150*  BUN 11 13  CREATININE 0.79 0.75  CALCIUM 8.5* 9.2   Liver Function Tests: No results for input(s): "AST", "ALT", "ALKPHOS", "BILITOT", "PROT", "ALBUMIN" in the last 168 hours. CBG: No results for input(s): "GLUCAP" in the last 168 hours.  Discharge time spent: greater than 30 minutes.  Signed: Loletha Grayer, MD [facility] 03/29/2022

## 2022-03-29 NOTE — Assessment & Plan Note (Signed)
On Flomax 

## 2022-04-02 LAB — LEGIONELLA PNEUMOPHILA SEROGP 1 UR AG: L. pneumophila Serogp 1 Ur Ag: NEGATIVE

## 2022-04-02 LAB — CULTURE, BLOOD (ROUTINE X 2)
Culture: NO GROWTH
Culture: NO GROWTH
Special Requests: ADEQUATE

## 2022-04-24 DIAGNOSIS — R972 Elevated prostate specific antigen [PSA]: Secondary | ICD-10-CM | POA: Diagnosis not present

## 2022-06-28 ENCOUNTER — Emergency Department: Payer: PPO

## 2022-06-28 ENCOUNTER — Inpatient Hospital Stay
Admission: EM | Admit: 2022-06-28 | Discharge: 2022-06-30 | DRG: 871 | Disposition: A | Discharge status: Home / Self Care | Payer: PPO | Attending: Internal Medicine | Admitting: Internal Medicine

## 2022-06-28 ENCOUNTER — Other Ambulatory Visit: Payer: Self-pay

## 2022-06-28 DIAGNOSIS — J189 Pneumonia, unspecified organism: Secondary | ICD-10-CM | POA: Diagnosis not present

## 2022-06-28 DIAGNOSIS — J449 Chronic obstructive pulmonary disease, unspecified: Secondary | ICD-10-CM | POA: Diagnosis present

## 2022-06-28 DIAGNOSIS — Z833 Family history of diabetes mellitus: Secondary | ICD-10-CM | POA: Diagnosis not present

## 2022-06-28 DIAGNOSIS — R112 Nausea with vomiting, unspecified: Secondary | ICD-10-CM | POA: Diagnosis not present

## 2022-06-28 DIAGNOSIS — J441 Chronic obstructive pulmonary disease with (acute) exacerbation: Secondary | ICD-10-CM | POA: Diagnosis not present

## 2022-06-28 DIAGNOSIS — E860 Dehydration: Secondary | ICD-10-CM | POA: Diagnosis not present

## 2022-06-28 DIAGNOSIS — I2510 Atherosclerotic heart disease of native coronary artery without angina pectoris: Secondary | ICD-10-CM | POA: Diagnosis not present

## 2022-06-28 DIAGNOSIS — I700 Atherosclerosis of aorta: Secondary | ICD-10-CM | POA: Diagnosis present

## 2022-06-28 DIAGNOSIS — Z8042 Family history of malignant neoplasm of prostate: Secondary | ICD-10-CM | POA: Diagnosis not present

## 2022-06-28 DIAGNOSIS — J168 Pneumonia due to other specified infectious organisms: Secondary | ICD-10-CM | POA: Diagnosis not present

## 2022-06-28 DIAGNOSIS — R000 Tachycardia, unspecified: Secondary | ICD-10-CM | POA: Diagnosis not present

## 2022-06-28 DIAGNOSIS — R0602 Shortness of breath: Secondary | ICD-10-CM | POA: Diagnosis not present

## 2022-06-28 DIAGNOSIS — Z981 Arthrodesis status: Secondary | ICD-10-CM | POA: Diagnosis not present

## 2022-06-28 DIAGNOSIS — J439 Emphysema, unspecified: Secondary | ICD-10-CM | POA: Diagnosis not present

## 2022-06-28 DIAGNOSIS — Z720 Tobacco use: Secondary | ICD-10-CM | POA: Diagnosis present

## 2022-06-28 DIAGNOSIS — E871 Hypo-osmolality and hyponatremia: Secondary | ICD-10-CM | POA: Diagnosis not present

## 2022-06-28 DIAGNOSIS — Z825 Family history of asthma and other chronic lower respiratory diseases: Secondary | ICD-10-CM | POA: Diagnosis not present

## 2022-06-28 DIAGNOSIS — F17210 Nicotine dependence, cigarettes, uncomplicated: Secondary | ICD-10-CM | POA: Diagnosis present

## 2022-06-28 DIAGNOSIS — E785 Hyperlipidemia, unspecified: Secondary | ICD-10-CM | POA: Diagnosis not present

## 2022-06-28 DIAGNOSIS — I10 Essential (primary) hypertension: Secondary | ICD-10-CM | POA: Diagnosis not present

## 2022-06-28 DIAGNOSIS — R918 Other nonspecific abnormal finding of lung field: Secondary | ICD-10-CM | POA: Diagnosis present

## 2022-06-28 DIAGNOSIS — I493 Ventricular premature depolarization: Secondary | ICD-10-CM | POA: Diagnosis present

## 2022-06-28 DIAGNOSIS — N400 Enlarged prostate without lower urinary tract symptoms: Secondary | ICD-10-CM | POA: Diagnosis not present

## 2022-06-28 DIAGNOSIS — Z1152 Encounter for screening for COVID-19: Secondary | ICD-10-CM

## 2022-06-28 DIAGNOSIS — M1990 Unspecified osteoarthritis, unspecified site: Secondary | ICD-10-CM | POA: Diagnosis not present

## 2022-06-28 DIAGNOSIS — J440 Chronic obstructive pulmonary disease with acute lower respiratory infection: Secondary | ICD-10-CM | POA: Diagnosis not present

## 2022-06-28 DIAGNOSIS — Z8619 Personal history of other infectious and parasitic diseases: Secondary | ICD-10-CM

## 2022-06-28 DIAGNOSIS — A419 Sepsis, unspecified organism: Principal | ICD-10-CM | POA: Diagnosis present

## 2022-06-28 DIAGNOSIS — K219 Gastro-esophageal reflux disease without esophagitis: Secondary | ICD-10-CM | POA: Diagnosis not present

## 2022-06-28 DIAGNOSIS — R062 Wheezing: Secondary | ICD-10-CM | POA: Diagnosis not present

## 2022-06-28 DIAGNOSIS — F1010 Alcohol abuse, uncomplicated: Secondary | ICD-10-CM | POA: Diagnosis not present

## 2022-06-28 DIAGNOSIS — Z79899 Other long term (current) drug therapy: Secondary | ICD-10-CM

## 2022-06-28 DIAGNOSIS — R911 Solitary pulmonary nodule: Secondary | ICD-10-CM | POA: Diagnosis present

## 2022-06-28 HISTORY — DX: Chronic obstructive pulmonary disease, unspecified: J44.9

## 2022-06-28 LAB — CBC WITH DIFFERENTIAL/PLATELET
Abs Immature Granulocytes: 0.08 10*3/uL — ABNORMAL HIGH (ref 0.00–0.07)
Abs Immature Granulocytes: 0.09 10*3/uL — ABNORMAL HIGH (ref 0.00–0.07)
Basophils Absolute: 0.1 10*3/uL (ref 0.0–0.1)
Basophils Absolute: 0 10*3/uL (ref 0.0–0.1)
Basophils Relative: 0 %
Basophils Relative: 0 %
Eosinophils Absolute: 0 10*3/uL (ref 0.0–0.5)
Eosinophils Absolute: 0 10*3/uL (ref 0.0–0.5)
Eosinophils Relative: 0 %
Eosinophils Relative: 0 %
Hemoglobin: 16.6 g/dL (ref 13.0–17.0)
Hemoglobin: 15.6 g/dL (ref 13.0–17.0)
Immature Granulocytes: 1 %
Immature Granulocytes: 1 %
Lymphocytes Relative: 5 %
Lymphocytes Relative: 5 %
Lymphs Abs: 0.7 10*3/uL (ref 0.7–4.0)
Lymphs Abs: 0.8 10*3/uL (ref 0.7–4.0)
Monocytes Absolute: 0.8 10*3/uL (ref 0.1–1.0)
Monocytes Absolute: 0.9 10*3/uL (ref 0.1–1.0)
Monocytes Relative: 5 %
Monocytes Relative: 5 %
Neutro Abs: 14.5 10*3/uL — ABNORMAL HIGH (ref 1.7–7.7)
Neutro Abs: 15.3 10*3/uL — ABNORMAL HIGH (ref 1.7–7.7)
Neutrophils Relative %: 89 %
Neutrophils Relative %: 89 %
Platelets: 153 10*3/uL (ref 150–400)
Platelets: 154 10*3/uL (ref 150–400)
WBC: 16.2 10*3/uL — ABNORMAL HIGH (ref 4.0–10.5)
WBC: 17.2 10*3/uL — ABNORMAL HIGH (ref 4.0–10.5)
nRBC: 0 % (ref 0.0–0.2)
nRBC: 0 % (ref 0.0–0.2)

## 2022-06-28 LAB — URINALYSIS, ROUTINE W REFLEX MICROSCOPIC
Bilirubin Urine: NEGATIVE
Glucose, UA: NEGATIVE mg/dL
Hgb urine dipstick: NEGATIVE
Ketones, ur: 5 mg/dL — AB
Leukocytes,Ua: NEGATIVE
Nitrite: NEGATIVE
Protein, ur: NEGATIVE mg/dL
Specific Gravity, Urine: 1.006 (ref 1.005–1.030)
Squamous Epithelial / HPF: NONE SEEN /HPF (ref 0–5)
pH: 5 (ref 5.0–8.0)

## 2022-06-28 LAB — COMPREHENSIVE METABOLIC PANEL
ALT: 34 U/L (ref 0–44)
AST: 47 U/L — ABNORMAL HIGH (ref 15–41)
Albumin: 4.4 g/dL (ref 3.5–5.0)
Alkaline Phosphatase: 73 U/L (ref 38–126)
Anion gap: 10 (ref 5–15)
BUN: 12 mg/dL (ref 8–23)
CO2: 20 mmol/L — ABNORMAL LOW (ref 22–32)
Calcium: 8.7 mg/dL — ABNORMAL LOW (ref 8.9–10.3)
Chloride: 99 mmol/L (ref 98–111)
Creatinine, Ser: 0.99 mg/dL (ref 0.61–1.24)
GFR, Estimated: >60 mL/min (ref >60)
Glucose, Bld: 83 mg/dL (ref 70–99)
Potassium: 4.3 mmol/L (ref 3.5–5.1)
Sodium: 129 mmol/L — ABNORMAL LOW (ref 135–145)
Total Bilirubin: 2.3 mg/dL — ABNORMAL HIGH (ref 0.3–1.2)
Total Protein: 7.8 g/dL (ref 6.5–8.1)

## 2022-06-28 LAB — LACTIC ACID, PLASMA
Lactic Acid, Venous: 1.7 mmol/L (ref 0.5–1.9)
Lactic Acid, Venous: 1.9 mmol/L (ref 0.5–1.9)

## 2022-06-28 LAB — BLOOD GAS, VENOUS
Acid-base deficit: 2 mmol/L (ref 0.0–2.0)
Bicarbonate: 22.3 mmol/L (ref 20.0–28.0)
O2 Saturation: 78 %
Patient temperature: 37
pCO2, Ven: 36 mmHg — ABNORMAL LOW (ref 44–60)
pH, Ven: 7.4 (ref 7.25–7.43)
pO2, Ven: 45 mmHg (ref 32–45)

## 2022-06-28 LAB — STREP PNEUMONIAE URINARY ANTIGEN: Strep Pneumo Urinary Antigen: NEGATIVE

## 2022-06-28 LAB — PHOSPHORUS: Phosphorus: 2.3 mg/dL — ABNORMAL LOW (ref 2.5–4.6)

## 2022-06-28 LAB — TROPONIN I (HIGH SENSITIVITY)
Troponin I (High Sensitivity): 8 ng/L (ref <18)
Troponin I (High Sensitivity): 8 ng/L (ref <18)

## 2022-06-28 LAB — RESP PANEL BY RT-PCR (RSV, FLU A&B, COVID)  RVPGX2
Influenza A by PCR: NEGATIVE
Influenza B by PCR: NEGATIVE
Resp Syncytial Virus by PCR: NEGATIVE
SARS Coronavirus 2 by RT PCR: NEGATIVE

## 2022-06-28 LAB — PROCALCITONIN: Procalcitonin: 0.73 ng/mL

## 2022-06-28 LAB — APTT: aPTT: 34 seconds (ref 24–36)

## 2022-06-28 LAB — BRAIN NATRIURETIC PEPTIDE: B Natriuretic Peptide: 39.4 pg/mL (ref 0.0–100.0)

## 2022-06-28 LAB — PROTIME-INR
INR: 1.2 (ref 0.8–1.2)
Prothrombin Time: 14.8 seconds (ref 11.4–15.2)

## 2022-06-28 LAB — MAGNESIUM: Magnesium: 1.3 mg/dL — ABNORMAL LOW (ref 1.7–2.4)

## 2022-06-28 MED ORDER — PANCRELIPASE (LIP-PROT-AMYL) 12000-38000 UNITS PO CPEP
24000.0000 [IU] | ORAL_CAPSULE | Freq: Three times a day (TID) | ORAL | Status: DC
  Administered 2022-06-28 – 2022-06-30 (×5): 24000 [IU] via ORAL
  Filled 2022-06-28 (×6): qty 2

## 2022-06-28 MED ORDER — SODIUM CHLORIDE 0.9 % IV BOLUS
1000.0000 mL | Freq: Once | INTRAVENOUS | Status: AC
  Administered 2022-06-28: 1000 mL via INTRAVENOUS

## 2022-06-28 MED ORDER — DM-GUAIFENESIN ER 30-600 MG PO TB12: 1.0000 | ORAL_TABLET | Freq: Two times a day (BID) | ORAL | Status: DC | PRN

## 2022-06-28 MED ORDER — ONDANSETRON HCL 4 MG/2ML IJ SOLN: 4.0000 mg | Freq: Three times a day (TID) | INTRAMUSCULAR | Status: DC | PRN

## 2022-06-28 MED ORDER — LORAZEPAM 1 MG PO TABS: 1.0000 mg | ORAL_TABLET | ORAL | Status: DC | PRN

## 2022-06-28 MED ORDER — TAMSULOSIN HCL 0.4 MG PO CAPS
0.4000 mg | ORAL_CAPSULE | Freq: Every day | ORAL | Status: DC
  Administered 2022-06-28 – 2022-06-30 (×3): 0.4 mg via ORAL
  Filled 2022-06-28 (×3): qty 1

## 2022-06-28 MED ORDER — ALBUTEROL SULFATE (2.5 MG/3ML) 0.083% IN NEBU
2.5000 mg | INHALATION_SOLUTION | RESPIRATORY_TRACT | Status: DC | PRN
  Administered 2022-06-28: 2.5 mg via RESPIRATORY_TRACT
  Filled 2022-06-28: qty 3

## 2022-06-28 MED ORDER — LORAZEPAM 2 MG PO TABS
0.0000 mg | ORAL_TABLET | Freq: Four times a day (QID) | ORAL | Status: DC
  Administered 2022-06-28 – 2022-06-30 (×2): 2 mg via ORAL
  Filled 2022-06-28 (×2): qty 1

## 2022-06-28 MED ORDER — SODIUM CHLORIDE 0.9 % IV SOLN
2.0000 g | INTRAVENOUS | Status: DC
  Administered 2022-06-28 – 2022-06-29 (×2): 2 g via INTRAVENOUS
  Filled 2022-06-28 (×3): qty 20

## 2022-06-28 MED ORDER — LORAZEPAM 2 MG PO TABS: 0.0000 mg | ORAL_TABLET | Freq: Two times a day (BID) | ORAL | Status: DC

## 2022-06-28 MED ORDER — IPRATROPIUM-ALBUTEROL 0.5-2.5 (3) MG/3ML IN SOLN
3.0000 mL | RESPIRATORY_TRACT | Status: DC
  Administered 2022-06-28 (×2): 3 mL via RESPIRATORY_TRACT
  Filled 2022-06-28 (×2): qty 3

## 2022-06-28 MED ORDER — ACETAMINOPHEN 325 MG PO TABS: 650.0000 mg | ORAL_TABLET | Freq: Four times a day (QID) | ORAL | Status: DC | PRN

## 2022-06-28 MED ORDER — MAGNESIUM SULFATE 4 GM/100ML IV SOLN
4.0000 g | Freq: Once | INTRAVENOUS | Status: AC
  Administered 2022-06-28: 4 g via INTRAVENOUS
  Filled 2022-06-28: qty 100

## 2022-06-28 MED ORDER — COLCHICINE 0.6 MG PO TABS
0.6000 mg | ORAL_TABLET | Freq: Two times a day (BID) | ORAL | Status: DC | PRN
  Filled 2022-06-28: qty 1

## 2022-06-28 MED ORDER — HYDRALAZINE HCL 20 MG/ML IJ SOLN: 5.0000 mg | INTRAMUSCULAR | Status: DC | PRN

## 2022-06-28 MED ORDER — THIAMINE HCL 100 MG/ML IJ SOLN: 100.0000 mg | Freq: Every day | INTRAMUSCULAR | Status: DC

## 2022-06-28 MED ORDER — NICOTINE 21 MG/24HR TD PT24
21.0000 mg | MEDICATED_PATCH | Freq: Every day | TRANSDERMAL | Status: DC
  Filled 2022-06-28: qty 1

## 2022-06-28 MED ORDER — SODIUM CHLORIDE 1 G PO TABS
1.0000 g | ORAL_TABLET | Freq: Two times a day (BID) | ORAL | Status: DC
  Administered 2022-06-28 – 2022-06-29 (×2): 1 g via ORAL
  Filled 2022-06-28 (×3): qty 1

## 2022-06-28 MED ORDER — IPRATROPIUM-ALBUTEROL 0.5-2.5 (3) MG/3ML IN SOLN
3.0000 mL | Freq: Once | RESPIRATORY_TRACT | Status: AC
  Administered 2022-06-28: 3 mL via RESPIRATORY_TRACT
  Filled 2022-06-28: qty 3

## 2022-06-28 MED ORDER — FOLIC ACID 1 MG PO TABS
1.0000 mg | ORAL_TABLET | Freq: Every day | ORAL | Status: DC
  Administered 2022-06-28 – 2022-06-30 (×3): 1 mg via ORAL
  Filled 2022-06-28 (×3): qty 1

## 2022-06-28 MED ORDER — LORAZEPAM 2 MG/ML IJ SOLN: 1.0000 mg | INTRAMUSCULAR | Status: DC | PRN

## 2022-06-28 MED ORDER — IOHEXOL 350 MG/ML SOLN
75.0000 mL | Freq: Once | INTRAVENOUS | Status: AC | PRN
  Administered 2022-06-28: 75 mL via INTRAVENOUS

## 2022-06-28 MED ORDER — ADULT MULTIVITAMIN W/MINERALS CH
1.0000 | ORAL_TABLET | Freq: Every day | ORAL | Status: DC
  Administered 2022-06-28 – 2022-06-30 (×3): 1 via ORAL
  Filled 2022-06-28 (×3): qty 1

## 2022-06-28 MED ORDER — IBUPROFEN 400 MG PO TABS
200.0000 mg | ORAL_TABLET | Freq: Four times a day (QID) | ORAL | Status: DC | PRN
  Administered 2022-06-29: 200 mg via ORAL
  Filled 2022-06-28: qty 1

## 2022-06-28 MED ORDER — METHYLPREDNISOLONE SODIUM SUCC 125 MG IJ SOLR
125.0000 mg | Freq: Once | INTRAMUSCULAR | Status: AC
  Administered 2022-06-28: 125 mg via INTRAVENOUS
  Filled 2022-06-28: qty 2

## 2022-06-28 MED ORDER — SODIUM CHLORIDE 0.9 % IV BOLUS
500.0000 mL | Freq: Once | INTRAVENOUS | Status: AC
  Administered 2022-06-28: 500 mL via INTRAVENOUS

## 2022-06-28 MED ORDER — HEPARIN SODIUM (PORCINE) 5000 UNIT/ML IJ SOLN: 5000.0000 [IU] | Freq: Three times a day (TID) | INTRAMUSCULAR | Status: DC

## 2022-06-28 MED ORDER — PANTOPRAZOLE SODIUM 40 MG PO TBEC
40.0000 mg | DELAYED_RELEASE_TABLET | Freq: Every day | ORAL | Status: DC
  Administered 2022-06-28 – 2022-06-30 (×3): 40 mg via ORAL
  Filled 2022-06-28 (×3): qty 1

## 2022-06-28 MED ORDER — AMLODIPINE BESYLATE 5 MG PO TABS
5.0000 mg | ORAL_TABLET | Freq: Every day | ORAL | Status: DC
  Administered 2022-06-28 – 2022-06-29 (×2): 5 mg via ORAL
  Filled 2022-06-28 (×2): qty 1

## 2022-06-28 MED ORDER — IPRATROPIUM-ALBUTEROL 0.5-2.5 (3) MG/3ML IN SOLN
3.0000 mL | Freq: Three times a day (TID) | RESPIRATORY_TRACT | Status: DC
  Administered 2022-06-29 – 2022-06-30 (×4): 3 mL via RESPIRATORY_TRACT
  Filled 2022-06-28 (×4): qty 3

## 2022-06-28 MED ORDER — SODIUM CHLORIDE 0.9 % IV SOLN
500.0000 mg | INTRAVENOUS | Status: DC
  Administered 2022-06-28: 500 mg via INTRAVENOUS
  Filled 2022-06-28 (×2): qty 5

## 2022-06-28 MED ORDER — THIAMINE MONONITRATE 100 MG PO TABS
100.0000 mg | ORAL_TABLET | Freq: Every day | ORAL | Status: DC
  Administered 2022-06-28 – 2022-06-30 (×3): 100 mg via ORAL
  Filled 2022-06-28 (×3): qty 1

## 2022-06-28 MED ORDER — IPRATROPIUM-ALBUTEROL 0.5-2.5 (3) MG/3ML IN SOLN
3.0000 mL | Freq: Once | RESPIRATORY_TRACT | Status: DC
  Administered 2022-06-28: 3 mL via RESPIRATORY_TRACT

## 2022-06-28 NOTE — Consult Note (Signed)
[facility]  Telephone:(336) [phone] Fax:(336) [phone]  ID: Cira Servant OB: [birth year]  MR#: [id number]  [id number]  Patient Care Team: Rusty Aus, MD as PCP - General (Internal Medicine)  CHIEF COMPLAINT: Right upper lobe pulmonary nodule.  INTERVAL HISTORY: Patient is a 67 year old male who presented to the hospital with increasing shortness of breath.  Subsequent workup included CT scan which revealed a 1.9 cm spiculated right upper lobe pulmonary nodule highly suspicious for underlying malignancy.  Nodule was also seen on CT scan at patient's previous admission in December 2023 at which time was measured at 1.4 cm.  He otherwise feels well.  He has no neurologic complaints.  He denies any fevers.  He has a fair appetite, but denies weight loss.  He has noted chest pain, cough, or hemoptysis.  He denies any nausea, vomiting, constipation, or diarrhea.  He has no urinary complaints.  Patient otherwise feels well and offers no further specific complaints today.  REVIEW OF SYSTEMS:   Review of Systems  Constitutional: Negative.  Negative for fever, malaise/fatigue and weight loss.  Respiratory:  Positive for shortness of breath. Negative for cough and hemoptysis.   Cardiovascular: Negative.  Negative for chest pain and leg swelling.  Gastrointestinal:  Negative for abdominal pain.  Genitourinary: Negative.  Negative for dysuria.  Musculoskeletal: Negative.  Negative for back pain.  Skin: Negative.  Negative for rash.  Neurological: Negative.  Negative for dizziness, focal weakness, weakness and headaches.  Psychiatric/Behavioral: Negative.  The patient is not nervous/anxious.     As per HPI. Otherwise, a complete review of systems is negative.  PAST MEDICAL HISTORY: Past Medical History:  Diagnosis Date   Arthritis    COPD (chronic obstructive pulmonary disease) (HCC)    GERD (gastroesophageal reflux disease)    Hepatitis B 1993   Hypertension     Squamous cell carcinoma, arm, left     PAST SURGICAL HISTORY: Past Surgical History:  Procedure Laterality Date   ANTERIOR CERVICAL DECOMP/DISCECTOMY FUSION N/A 01/01/2021   Procedure: C5-6 ANTERIOR CERVICAL DECOMPRESSION/DISCECTOMY FUSION 1 LEVEL;  Surgeon: Meade Maw, MD;  Location: [facility] ORS;  Service: Neurosurgery;  Laterality: N/A;   NO PAST SURGERIES      FAMILY HISTORY: Family History  Problem Relation Age of Onset   Diabetes type II Mother    COPD Mother    Dementia Father    Prostate cancer Father     ADVANCED DIRECTIVES (Y/N):  @ADVDIR @  HEALTH MAINTENANCE: Social History   Tobacco Use   Smoking status: Some Days    Types: Cigarettes    Last attempt to quit: 1992    Years since quitting: 32.2   Smokeless tobacco: Current    Types: Snuff  Vaping Use   Vaping Use: Never used  Substance Use Topics   Alcohol use: Yes    Comment: occassional   Drug use: Not Currently     Colonoscopy:  PAP:  Bone density:  Lipid panel:  No Known Allergies  Current Facility-Administered Medications  Medication Dose Route Frequency Provider Last Rate Last Admin   albuterol (PROVENTIL) (2.5 MG/3ML) 0.083% nebulizer solution 2.5 mg  2.5 mg Nebulization Q4H PRN Ivor Costa, MD   2.5 mg at 06/28/22 1422   azithromycin (ZITHROMAX) 500 mg in sodium chloride 0.9 % 250 mL IVPB  500 mg Intravenous Q24H Vanessa Archer, MD 250 mL/hr at 06/28/22 1406 500 mg at 06/28/22 1406   cefTRIAXone (ROCEPHIN) 2 g in sodium chloride 0.9 % 100  mL IVPB  2 g Intravenous Q24H Vanessa Williams, MD   Stopped at 06/28/22 1423   dextromethorphan-guaiFENesin (MUCINEX DM) 30-600 MG per 12 hr tablet 1 tablet  1 tablet Oral BID PRN Ivor Costa, MD       folic acid (FOLVITE) tablet 1 mg  1 mg Oral Daily Ivor Costa, MD       hydrALAZINE (APRESOLINE) injection 5 mg  5 mg Intravenous Q2H PRN Ivor Costa, MD       ibuprofen (ADVIL) tablet 200 mg  200 mg Oral Q6H PRN Ivor Costa, MD       ipratropium-albuterol (DUONEB)  0.5-2.5 (3) MG/3ML nebulizer solution 3 mL  3 mL Nebulization Q4H Ivor Costa, MD       lipase/protease/amylase (CREON) capsule 24,000 Units  24,000 Units Oral TID AC Ivor Costa, MD       LORazepam (ATIVAN) tablet 1-4 mg  1-4 mg Oral Q1H PRN Ivor Costa, MD       Or   LORazepam (ATIVAN) injection 1-4 mg  1-4 mg Intravenous Q1H PRN Ivor Costa, MD       LORazepam (ATIVAN) tablet 0-4 mg  0-4 mg Oral Q6H Ivor Costa, MD       Followed by   Derrill Memo ON 06/30/2022] LORazepam (ATIVAN) tablet 0-4 mg  0-4 mg Oral Q12H Ivor Costa, MD       multivitamin with minerals tablet 1 tablet  1 tablet Oral Daily Ivor Costa, MD       nicotine (NICODERM CQ - dosed in mg/24 hours) patch 21 mg  21 mg Transdermal Daily Ivor Costa, MD       ondansetron [facility]) injection 4 mg  4 mg Intravenous Q8H PRN Ivor Costa, MD       sodium chloride tablet 1 g  1 g Oral BID WC Ivor Costa, MD       thiamine (VITAMIN B1) tablet 100 mg  100 mg Oral Daily Ivor Costa, MD       Or   thiamine (VITAMIN B1) injection 100 mg  100 mg Intravenous Daily Ivor Costa, MD        OBJECTIVE: Vitals:   06/28/22 1255 06/28/22 1315  BP:  133/79  Pulse: (!) 115 (!) 123  Resp: 16 (!) 23  Temp:    SpO2: 99% 91%     There is no height or weight on file to calculate BMI.    ECOG FS:1 - Symptomatic but completely ambulatory  General: Well-developed, well-nourished, no acute distress. Eyes: Pink conjunctiva, anicteric sclera. HEENT: Normocephalic, moist mucous membranes. Lungs: No audible wheezing or coughing. Heart: Regular rate and rhythm. Abdomen: Soft, nontender, no obvious distention. Musculoskeletal: No edema, cyanosis, or clubbing. Neuro: Alert, answering all questions appropriately. Cranial nerves grossly intact. Skin: No rashes or petechiae noted. Psych: Normal affect. Lymphatics: No cervical, calvicular, axillary or inguinal LAD.   LAB RESULTS:  Lab Results  Component Value Date   NA 129 (L) 06/28/2022   K 4.3 06/28/2022   CL 99  06/28/2022   CO2 20 (L) 06/28/2022   GLUCOSE 83 06/28/2022   BUN 12 06/28/2022   CREATININE 0.99 06/28/2022   CALCIUM 8.7 (L) 06/28/2022   PROT 7.8 06/28/2022   ALBUMIN 4.4 06/28/2022   AST 47 (H) 06/28/2022   ALT 34 06/28/2022   ALKPHOS 73 06/28/2022   BILITOT 2.3 (H) 06/28/2022   GFRNONAA >60 06/28/2022    Lab Results  Component Value Date   WBC 17.2 (H) 06/28/2022   NEUTROABS 15.3 (  H) 06/28/2022   HGB 15.6 06/28/2022   HCT RESULTS UNAVAILABLE DUE TO INTERFERING SUBSTANCE 06/28/2022   MCV RESULTS UNAVAILABLE DUE TO INTERFERING SUBSTANCE 06/28/2022   PLT 154 06/28/2022     STUDIES: CT Angio Chest PE W and/or Wo Contrast  Result Date: 06/28/2022 CLINICAL DATA:  Shortness of breath. EXAM: CT ANGIOGRAPHY CHEST WITH CONTRAST TECHNIQUE: Multidetector CT imaging of the chest was performed using the standard protocol during bolus administration of intravenous contrast. Multiplanar CT image reconstructions and MIPs were obtained to evaluate the vascular anatomy. RADIATION DOSE REDUCTION: This exam was performed according to the departmental dose-optimization program which includes automated exposure control, adjustment of the mA and/or kV according to patient size and/or use of iterative reconstruction technique. CONTRAST:  57mL OMNIPAQUE IOHEXOL 350 MG/ML SOLN COMPARISON:  March 28, 2022. FINDINGS: Cardiovascular: Satisfactory opacification of the pulmonary arteries to the segmental level. No evidence of pulmonary embolism. Normal heart size. No pericardial effusion. Coronary artery calcifications are noted. Mediastinum/Nodes: No enlarged mediastinal, hilar, or axillary lymph nodes. Thyroid gland, trachea, and esophagus demonstrate no significant findings. Lungs/Pleura: No pneumothorax or pleural effusion is noted. 19 x 14 mm irregular spiculated density is noted in right upper lobe best seen on image number 137 of series 8. This is significantly enlarged compared to prior exam and is  highly concerning for malignancy. 7 mm nodular density is noted posteriorly in right lower lobe best seen on image number 85 of series 5, concerning for possible pulmonary nodule or metastatic disease. Emphysematous disease is noted bilaterally. Patchy airspace opacities are noted in the right upper and lower lobes which may represent multifocal pneumonia. Stable 4 mm nodule noted anteriorly in left upper lobe best seen on image number 40 of series 5. Upper Abdomen: No acute abnormality. Musculoskeletal: No chest wall abnormality. No acute or significant osseous findings. Review of the MIP images confirms the above findings. IMPRESSION: 19 x 14 mm irregular spiculated density is noted in right upper lobe which is significantly enlarged compared to prior exam and highly concerning for primary malignancy. PET scan is recommended for further evaluation. No definite evidence of pulmonary embolus. There is interval development of multiple patchy airspace opacities in the right upper and lower lobes which may represent multifocal pneumonia. Possible 7 mm nodular density seen posteriorly in right lower lobe concerning for possible metastatic disease. Stable 4 mm nodule is noted in left upper lobe compared to prior exam. Coronary artery calcifications are noted. Aortic Atherosclerosis (ICD10-I70.0) and Emphysema (ICD10-J43.9). Electronically Signed   By: Marijo Conception M.D.   On: 06/28/2022 13:01   DG Chest 2 View  Result Date: 06/28/2022 CLINICAL DATA:  67 year old male with 3 weeks of shortness of breath. EXAM: CHEST - 2 VIEW COMPARISON:  CTA chest 03/28/2022 and earlier. FINDINGS: Seated AP and lateral views at 1159 hours. Centrilobular emphysema demonstrated by CTA last year. Lung volumes and mediastinal contours appear stable and within normal limits, but there is increased perihilar and right lower lung reticulonodular opacity on the AP view, probably in the lower lobe on the lateral. No pneumothorax or pleural  effusion. Left lung vascularity appears within normal limits. No acute osseous abnormality identified. Negative visible bowel gas. IMPRESSION: Emphysema [id number]) with acute right lung reticulonodular opacity most suspicious for infectious exacerbation. But note that follow-up of a right mid lung indeterminate nodule/opacity was recommended on December Chest CTA. No pleural effusion. Electronically Signed   By: Genevie Ann M.D.   On: 06/28/2022 12:11  ASSESSMENT: Right upper lobe pulmonary nodule.  PLAN:    Right upper lobe pulmonary nodule: CT scan results from June 28, 2022 reviewed independently and report as above with suspicious 1.9 cm spiculated right upper lobe lung nodule.  Previous CT scan on March 28, 2022 also revealed the same nodule, but at that time it measured 1.4 cm.  No intervention is needed at this time.  Patient will require PET scan upon discharge.  Follow-up in the cancer center after PET is completed to discuss further diagnostic planning with biopsy as well as treatment planning. Hyponatremia: Monitor.  Patient's most recent sodium is 129. Hyperbilirubinemia: Patient noted to have a total bilirubin of 2.3.  He is reported an every day drinker.  Monitor. Leukocytosis: Likely reactive, monitor.  Appreciate consult, will follow.   Lloyd Huger, MD   06/28/2022 3:07 PM

## 2022-06-28 NOTE — ED Notes (Signed)
Pt returned from CT °

## 2022-06-28 NOTE — ED Triage Notes (Addendum)
Pt to ED vai [facility] for SOB x3wks. EMS reports pt at 92-93% RA on arrival. Pt also endorses N/V/D earier in the week. Pt does not wear O2 at home. Does take albuterol nebulizer for COPD. Per EMS AOx4, GCS 15. EMS vitals WNL and received 1 duoneb treatment en route.

## 2022-06-28 NOTE — ED Notes (Signed)
Pt returned to room  

## 2022-06-28 NOTE — Sepsis Progress Note (Signed)
Sepsis protocol is being followed by [facility]. 

## 2022-06-28 NOTE — ED Notes (Signed)
Pt states he is a current everyday drinker. Drinks about 6 or more beers a day. Last drink was last night.

## 2022-06-28 NOTE — Consult Note (Signed)
CODE SEPSIS - PHARMACY COMMUNICATION  **Broad Spectrum Antibiotics should be administered within 1 hour of Sepsis diagnosis**  Time Code Sepsis Called/Page Received: 1334  Antibiotics Ordered: Ceftriaxone, Azithromycin  Time of 1st antibiotic administration: 1406  Additional action taken by pharmacy: N/A  If necessary, Name of Provider/Nurse Contacted: N/A    Delena Bali ,PharmD Clinical Pharmacist  06/28/2022  1:33 PM

## 2022-06-28 NOTE — ED Notes (Signed)
Advised nurse that patient has ready bed 

## 2022-06-28 NOTE — ED Notes (Signed)
Overheard pt telling family member it hurts when he pees. Asked pt about this and he states it is hard to start peeing and to continue peeing without straining, pt states "it started today it seems like." Pt has voided 2x with a total vol of 738mL. After bladder scanning pt he has 372mL of urine remaining.

## 2022-06-28 NOTE — ED Provider Notes (Signed)
[facility] Provider Note    Event Date/Time   First MD Initiated Contact with Patient 06/28/22 1056     (approximate)   History   Shortness of Breath   HPI  David Clark is a 67 y.o. male with arthritis, GERD, hypertensive, squamous cell cancer who comes in with shortness of breath.  On review of records patient was admitted back on December 2023 with community-acquired pneumonia, COPD exacerbation.  Patient was found to have a spiculated nodule that he was post to get a outpatient PET CT.   Patient reports shortness of breath for the past 3 weeks but acutely worsening over the past 2 days.  Oxygen level was 92 to 93% on room air.  He does report about a week ago having some nausea vomiting diarrhea but this is since resolved.  Does not wear oxygen at home.  He does state states that the [facility] given with EMS he was feeling slightly improved.  He does report some blood-tinged sputum.  When asked about his elevated heart rates he states that he typically runs elevated.  Denies any abdominal pain, falls or hitting his head  Physical Exam   Triage Vital Signs: ED Triage Vitals  Enc Vitals Group     BP      Pulse      Resp      Temp      Temp src      SpO2      Weight      Height      Head Circumference      Peak Flow      Pain Score      Pain Loc      Pain Edu?      Excl. in [place]?     Most recent vital signs: Vitals:   06/28/22 1255 06/28/22 1315  BP:  133/79  Pulse: (!) 115 (!) 123  Resp: 16 (!) 23  Temp:    SpO2: 99% 91%     General: Awake, no distress.  CV:  Good peripheral perfusion. Tachy  Resp:  Normal effort.  Slight wheeze noted Abd:  No distention.  Other:  No swelling in legs.  No calf tenderness   ED Results / Procedures / Treatments   Labs (all labs ordered are listed, but only abnormal results are displayed) Labs Reviewed  COMPREHENSIVE METABOLIC PANEL - Abnormal; Notable for the following components:      Result  Value   Sodium 129 (*)    CO2 20 (*)    Calcium 8.7 (*)    AST 47 (*)    Total Bilirubin 2.3 (*)    All other components within normal limits  BLOOD GAS, VENOUS - Abnormal; Notable for the following components:   pCO2, Ven 36 (*)    All other components within normal limits  RESP PANEL BY RT-PCR (RSV, FLU A&B, COVID)  [id number]  BRAIN NATRIURETIC PEPTIDE  CBC WITH DIFFERENTIAL/PLATELET  TROPONIN I (HIGH SENSITIVITY)  TROPONIN I (HIGH SENSITIVITY)     EKG  My interpretation of EKG:  Sinus tachycardia rate of 122 without any ST elevations or T wave inversions, occasional PVC, normal intervals  RADIOLOGY I have reviewed the xray personally and interpreted patient has some emphysema and may be semiformed in the right lobe.  PROCEDURES:  Critical Care performed: Yes, see critical care procedure note(s)  .1-3 Lead EKG Interpretation  Performed by: Vanessa [place], MD Authorized by: Vanessa [place], MD  Interpretation: abnormal     ECG rate:  120   ECG rate assessment: tachycardic     Rhythm: sinus tachycardia     Ectopy: none     Conduction: normal   .Critical Care  Performed by: Vanessa Virgil, MD Authorized by: Vanessa [place], MD   Critical care provider statement:    Critical care time (minutes):  30   Critical care was necessary to treat or prevent imminent or life-threatening deterioration of the following conditions:  Sepsis   Critical care was time spent personally by me on the following activities:  Development of treatment plan with patient or surrogate, discussions with consultants, evaluation of patient's response to treatment, examination of patient, ordering and review of laboratory studies, ordering and review of radiographic studies, ordering and performing treatments and interventions, pulse oximetry, re-evaluation of patient's condition and review of old charts    MEDICATIONS ORDERED IN ED: Medications  ipratropium-albuterol (DUONEB) 0.5-2.5 (3) MG/3ML  nebulizer solution 3 mL (3 mLs Nebulization Given 06/28/22 1115)  sodium chloride 0.9 % bolus 500 mL (500 mLs Intravenous New Bag/Given 06/28/22 1131)  iohexol (OMNIPAQUE) 350 MG/ML injection 75 mL (75 mLs Intravenous Contrast Given 06/28/22 1228)     IMPRESSION / MDM / [place] / ED COURSE  I reviewed the triage vital signs and the nursing notes.   Patient's presentation is most consistent with acute presentation with potential threat to life or bodily function.   Patient comes in tachycardic, oxygen levels 91% no significant wheezing may be slight but differential includes COVID, flu, pneumonia, pulmonary embolism.  Patient given a DuoNeb and some fluids for tachycardia  Repeat evaluation after DuoNeb patient does report feeling better. COVID, flu are negative.  CMP shows slightly low sodium of 129.  His liver test were slightly elevated but he had no right upper quadrant tenderness.  Initial troponin negative BNP negative CO2 normal.  Will proceed with CT imaging to rule out pulmonary embolism given blood-tinged sputum tachycardia and low oxygen levels  CT imaging as above.  I did discuss with patient the concern for possible cancer and possible concern for another nodule that could have to be it spreading.  He expressed understanding understands that he is have further workup for evaluation for cancer and his family member witnessed this conversation.  There is however concern for bilateral pneumonia.  Will start patient on  antibiotics.  Does meet sepsis criteria.  Patient given some more fluid for his tachycardia.  Will discuss the hospital team for admission.  Patient was placed on some oxygen for comfort given oxygen levels are bordering 90 to 91%  The patient is on the cardiac monitor to evaluate for evidence of arrhythmia and/or significant heart rate changes.      FINAL CLINICAL IMPRESSION(S) / ED DIAGNOSES   Final diagnoses:  Sepsis, due to unspecified organism,  unspecified whether acute organ dysfunction present ([place])  Lung mass  Chronic obstructive pulmonary disease, unspecified COPD type ([place])  Pneumonia due to infectious organism, unspecified laterality, unspecified part of lung     Rx / DC Orders   ED Discharge Orders     None        Note:  This document was prepared using Dragon voice recognition software and may include unintentional dictation errors.   Vanessa [place], MD 06/28/22 [phone]

## 2022-06-28 NOTE — ED Notes (Signed)
Patient transported to CT 

## 2022-06-28 NOTE — Discharge Instructions (Signed)
                  Intensive Outpatient Programs  [facility]    [facility] [address]     [address] [place],  [place]     [place], [place] [phone]      [phone]   Behavioral Health Outpatient   [facility]  (Inpatient and outpatient)  [phone] (Suboxone and Methadone) [address]           [phone]           [facility]    [facility] - Intensive Outpatient [address]     [address] [place], [place] [address]     [place], [place]  [phone]      [phone]  [facility] (Outpatient, Inpatient, [facility]  [facility] (Groups and [facility]) (insurance only) [phone]    [place], [place]          [phone]       [facility]    [facility] (for caregivers and family) [address]     [address] [place], [place]     [place], [place] [phone]      [phone]  Residential Treatment Programs  [facility]  [facility](2 years) Residential: 90 days)  [facility] [address]      [address] [place], [place]     [place], [place] [phone]      [phone] or [phone]  [facility]    [facility] [address]      [address] [place], [place]     [place], [place] [phone]      [phone]  [facility]   [facility] ([facility]) [address]     [address] [place], [place] [address]     [place], [place] [phone]      [phone] Admissions: 8am-3pm M-F  [facility]: Residential Program (90 Days)              [facility]: [facility]  [place], [place]     [place], [place]  [phone] or [phone]    (Walk in Hours over the weekend or by referral)   [place]: [facility]:[phone] (for crisis  response 24 hours a day) 

## 2022-06-28 NOTE — ED Notes (Signed)
Patient transported to X-ray 

## 2022-06-28 NOTE — H&P (Addendum)
History and Physical    David Clark [id number] DOB: 1955/07/17 DOA: 06/28/2022  Referring MD/NP/PA:   PCP: Rusty Aus, MD   Patient coming from:  The patient is coming from home.    Chief Complaint: SOB  HPI: David Clark is a 67 y.o. male with medical history significant of HTN, HLD, COPD, BPH, HBV, lung nodule, tobacco and alcohol abuse, who presents with SOB.  Patient states that he has shortness of breath for more than 3 days, which has been progressively worsening.  Patient has productive cough with yellow-colored sputum production.  He noticed bright red blood twice in his sputum.  Patient also reports mild central chest pain, which is sharp, nonradiating, intermittent.  Patient had subjective fever, no chills.  His temperature is 98.8 in ED.  Patient reports chronic intermittent diarrhea, which has been going on for more than 3 months.  He also has nausea and intermittent nonbilious nonbloody vomiting.  No abdominal pain.  Patient has dysuria, denies burning with urination, hematuria or urinary frequency.  He states that he drinks at least 6 beers every day.  He states that he stopped smoking before, recently restarted smoking in some days.   Data reviewed independently and ED Course: pt was found to have WBC 16.2, urinalysis negative except for rare bacteria, negative PCR for COVID, flu and RSV, sodium 129, GFR> 60.  Temperature normal, blood pressure 133/79, heart rate 133, RR 23, oxygen sat 91-99% on room air.  Chest x-ray with right lung reticulonodular opacity.  Patient is admitted to telemetry bed for observation.  CTA: 19 x 14 mm irregular spiculated density is noted in right upper lobe which is significantly enlarged compared to prior exam and highly concerning for primary malignancy. PET scan is recommended for further evaluation.   No definite evidence of pulmonary embolus.   There is interval development of multiple patchy airspace opacities in the right  upper and lower lobes which may represent multifocal pneumonia.   Possible 7 mm nodular density seen posteriorly in right lower lobe concerning for possible metastatic disease.   Stable 4 mm nodule is noted in left upper lobe compared to prior exam.   Coronary artery calcifications are noted.   Aortic Atherosclerosis (ICD10-I70.0) and Emphysema (ICD10-J43.9).   EKG: I have personally reviewed.  Sinus rhythm, QTc 426, LAE, PVC, poor R wave progression, low voltage.  Review of Systems:   General: no fevers, chills, no body weight gain, has poor appetite, has fatigue HEENT: no blurry vision, hearing changes or sore throat Respiratory: has dyspnea, coughing, no wheezing CV: no chest pain, no palpitations GI: has nausea, vomiting,  diarrhea, no constipation, abdominal pain, GU: has dysuria, no burning on urination, increased urinary frequency, hematuria  Ext: no leg edema Neuro: no unilateral weakness, numbness, or tingling, no vision change or hearing loss Skin: no rash, no skin tear. MSK: No muscle spasm, no deformity, no limitation of range of movement in spin Heme: No easy bruising.  Travel history: No recent long distant travel.   Allergy: No Known Allergies  Past Medical History:  Diagnosis Date   Arthritis    COPD (chronic obstructive pulmonary disease) (HCC)    GERD (gastroesophageal reflux disease)    Hepatitis B 1993   Hypertension    Squamous cell carcinoma, arm, left     Past Surgical History:  Procedure Laterality Date   ANTERIOR CERVICAL DECOMP/DISCECTOMY FUSION N/A 01/01/2021   Procedure: C5-6 ANTERIOR CERVICAL DECOMPRESSION/DISCECTOMY FUSION 1 LEVEL;  Surgeon: Izora Ribas,  Ardyth Gal, MD;  Location: [facility] ORS;  Service: Neurosurgery;  Laterality: N/A;   NO PAST SURGERIES      Social History:  reports that he has been smoking cigarettes. His smokeless tobacco use includes snuff. He reports current alcohol use. He reports that he does not currently use  drugs.  Family History:  Family History  Problem Relation Age of Onset   Diabetes type II Mother    COPD Mother    Dementia Father    Prostate cancer Father      Prior to Admission medications   Medication Sig Start Date End Date Taking? Authorizing Provider  albuterol (VENTOLIN HFA) 108 (90 Base) MCG/ACT inhaler Inhale 2 puffs into the lungs every 6 (six) hours as needed for wheezing or shortness of breath. 03/29/22   Loletha Grayer, MD  amLODipine (NORVASC) 5 MG tablet Take 5 mg by mouth at bedtime. 11/18/20   [provider]  colchicine 0.6 MG tablet Take 0.6 mg by mouth daily as needed (GOUT FLARE). 02/23/22   [provider]  omeprazole (PRILOSEC) 20 MG capsule Take 20 mg by mouth daily. 11/18/20   [provider]  tamsulosin (FLOMAX) 0.4 MG CAPS capsule Take 0.4 mg by mouth daily.    [provider]  umeclidinium-vilanterol (ANORO ELLIPTA) 62.5-25 MCG/ACT AEPB Inhale 1 puff into the lungs daily. 03/29/22   Loletha Grayer, MD    Physical Exam: Vitals:   06/28/22 1315 06/28/22 1512 06/28/22 1534 06/28/22 1712  BP: 133/79 (!) 145/85 130/82 (!) 154/79  Pulse: (!) 123 (!) 119 (!) 115 (!) 116  Resp: (!) 23 20 20 18   Temp:  99.2 F (37.3 C) 99.1 F (37.3 C) 98.7 F (37.1 C)  TempSrc:      SpO2: 91% 100% 100% 96%   General: Not in acute distress HEENT:       Eyes: PERRL, EOMI, no scleral icterus.       ENT: No discharge from the ears and nose, no pharynx injection, no tonsillar enlargement.        Neck: No JVD, no bruit, no mass felt. Heme: No neck lymph node enlargement. Cardiac: S1/S2, RRR, No murmurs, No gallops or rubs. Respiratory: Slightly decreased air movement bilaterally, no wheezing or rhonchi.Marland Kitchen GI: Soft, nondistended, nontender, no rebound pain, no organomegaly, BS present. GU: No hematuria Ext: No pitting leg edema bilaterally. 1+DP/PT pulse bilaterally. Musculoskeletal: No joint deformities, No joint redness or warmth, no  limitation of ROM in spin. Skin: No rashes.  Neuro: Alert, oriented X3, cranial nerves II-XII grossly intact, moves all extremities normally.  Psych: Patient is not psychotic, no suicidal or hemocidal ideation.  Labs on Admission: I have personally reviewed following labs and imaging studies  CBC: Recent Labs  Lab 06/28/22 1111 06/28/22 1352  WBC 16.2* 17.2*  NEUTROABS 14.5* 15.3*  HGB 16.6 15.6  HCT RESULTS UNAVAILABLE DUE TO INTERFERING SUBSTANCE RESULTS UNAVAILABLE DUE TO INTERFERING SUBSTANCE  MCV RESULTS UNAVAILABLE DUE TO INTERFERING SUBSTANCE RESULTS UNAVAILABLE DUE TO INTERFERING SUBSTANCE  PLT 153 [id number]   Basic Metabolic Panel: Recent Labs  Lab 06/28/22 1111 06/28/22 1419  NA 129*  --   K 4.3  --   CL 99  --   CO2 20*  --   GLUCOSE 83  --   BUN 12  --   CREATININE 0.99  --   CALCIUM 8.7*  --   MG  --  1.3*   GFR: CrCl cannot be calculated (Unknown ideal weight.). Liver Function Tests: Recent  Labs  Lab 06/28/22 1111  AST 47*  ALT 34  ALKPHOS 73  BILITOT 2.3*  PROT 7.8  ALBUMIN 4.4   No results for input(s): "LIPASE", "AMYLASE" in the last 168 hours. No results for input(s): "AMMONIA" in the last 168 hours. Coagulation Profile: Recent Labs  Lab 06/28/22 1644  INR 1.2   Cardiac Enzymes: No results for input(s): "CKTOTAL", "CKMB", "CKMBINDEX", "TROPONINI" in the last 168 hours. BNP (last 3 results) No results for input(s): "PROBNP" in the last 8760 hours. HbA1C: No results for input(s): "HGBA1C" in the last 72 hours. CBG: No results for input(s): "GLUCAP" in the last 168 hours. Lipid Profile: No results for input(s): "CHOL", "HDL", "LDLCALC", "TRIG", "CHOLHDL", "LDLDIRECT" in the last 72 hours. Thyroid Function Tests: No results for input(s): "TSH", "T4TOTAL", "FREET4", "T3FREE", "THYROIDAB" in the last 72 hours. Anemia Panel: No results for input(s): "VITAMINB12", "FOLATE", "FERRITIN", "TIBC", "IRON", "RETICCTPCT" in the last 72 hours. Urine  analysis:    Component Value Date/Time   COLORURINE YELLOW (A) 06/28/2022 1309   APPEARANCEUR CLEAR (A) 06/28/2022 1309   LABSPEC 1.006 06/28/2022 1309   PHURINE 5.0 06/28/2022 1309   GLUCOSEU NEGATIVE 06/28/2022 1309   HGBUR NEGATIVE 06/28/2022 1309   BILIRUBINUR NEGATIVE 06/28/2022 1309   KETONESUR 5 (A) 06/28/2022 1309   PROTEINUR NEGATIVE 06/28/2022 1309   NITRITE NEGATIVE 06/28/2022 1309   LEUKOCYTESUR NEGATIVE 06/28/2022 1309   Sepsis Labs: @LABRCNTIP (procalcitonin:4,lacticidven:4) ) Recent Results (from the past 240 hour(s))  Resp panel by RT-PCR (RSV, Flu A&B, Covid) Anterior Nasal Swab     Status: None   Collection Time: 06/28/22 11:11 AM   Specimen: Anterior Nasal Swab  Result Value Ref Range Status   SARS Coronavirus 2 by RT PCR NEGATIVE NEGATIVE Final    Comment: (NOTE) SARS-CoV-2 target nucleic acids are NOT DETECTED.  The SARS-CoV-2 RNA is generally detectable in upper respiratory specimens during the acute phase of infection. The lowest concentration of SARS-CoV-2 viral copies this assay can detect is 138 copies/mL. A negative result does not preclude SARS-Cov-2 infection and should not be used as the sole basis for treatment or other patient management decisions. A negative result may occur with  improper specimen collection/handling, submission of specimen other than nasopharyngeal swab, presence of viral mutation(s) within the areas targeted by this assay, and inadequate number of viral copies(<138 copies/mL). A negative result must be combined with clinical observations, patient history, and epidemiological information. The expected result is Negative.  Fact Sheet for Patients:  EntrepreneurPulse.com.au  Fact Sheet for Healthcare Providers:  IncredibleEmployment.be  This test is no t yet approved or cleared by the [place] FDA and  has been authorized for detection and/or diagnosis of SARS-CoV-2 by FDA under an  Emergency Use Authorization (EUA). This EUA will remain  in effect (meaning this test can be used) for the duration of the COVID-19 declaration under Section 564(b)(1) of the Act, 21 U.S.C.section 360bbb-3(b)(1), unless the authorization is terminated  or revoked sooner.       Influenza A by PCR NEGATIVE NEGATIVE Final   Influenza B by PCR NEGATIVE NEGATIVE Final    Comment: (NOTE) The Xpert Xpress SARS-CoV-2/FLU/RSV plus assay is intended as an aid in the diagnosis of influenza from Nasopharyngeal swab specimens and should not be used as a sole basis for treatment. Nasal washings and aspirates are unacceptable for Xpert Xpress SARS-CoV-2/FLU/RSV testing.  Fact Sheet for Patients: EntrepreneurPulse.com.au  Fact Sheet for Healthcare Providers: IncredibleEmployment.be  This test is not yet approved or cleared  by the [place] and has been authorized for detection and/or diagnosis of SARS-CoV-2 by FDA under an Emergency Use Authorization (EUA). This EUA will remain in effect (meaning this test can be used) for the duration of the COVID-19 declaration under Section 564(b)(1) of the Act, 21 U.S.C. section 360bbb-3(b)(1), unless the authorization is terminated or revoked.     Resp Syncytial Virus by PCR NEGATIVE NEGATIVE Final    Comment: (NOTE) Fact Sheet for Patients: EntrepreneurPulse.com.au  Fact Sheet for Healthcare Providers: IncredibleEmployment.be  This test is not yet approved or cleared by the [place] FDA and has been authorized for detection and/or diagnosis of SARS-CoV-2 by FDA under an Emergency Use Authorization (EUA). This EUA will remain in effect (meaning this test can be used) for the duration of the COVID-19 declaration under Section 564(b)(1) of the Act, 21 U.S.C. section 360bbb-3(b)(1), unless the authorization is terminated or revoked.  Performed at [facility], [address] [place], [place] [address]      Radiological Exams on Admission: CT Angio Chest PE W and/or Wo Contrast  Result Date: 06/28/2022 CLINICAL DATA:  Shortness of breath. EXAM: CT ANGIOGRAPHY CHEST WITH CONTRAST TECHNIQUE: Multidetector CT imaging of the chest was performed using the standard protocol during bolus administration of intravenous contrast. Multiplanar CT image reconstructions and MIPs were obtained to evaluate the vascular anatomy. RADIATION DOSE REDUCTION: This exam was performed according to the departmental dose-optimization program which includes automated exposure control, adjustment of the mA and/or kV according to patient size and/or use of iterative reconstruction technique. CONTRAST:  42mL OMNIPAQUE IOHEXOL 350 MG/ML SOLN COMPARISON:  March 28, 2022. FINDINGS: Cardiovascular: Satisfactory opacification of the pulmonary arteries to the segmental level. No evidence of pulmonary embolism. Normal heart size. No pericardial effusion. Coronary artery calcifications are noted. Mediastinum/Nodes: No enlarged mediastinal, hilar, or axillary lymph nodes. Thyroid gland, trachea, and esophagus demonstrate no significant findings. Lungs/Pleura: No pneumothorax or pleural effusion is noted. 19 x 14 mm irregular spiculated density is noted in right upper lobe best seen on image number 137 of series 8. This is significantly enlarged compared to prior exam and is highly concerning for malignancy. 7 mm nodular density is noted posteriorly in right lower lobe best seen on image number 85 of series 5, concerning for possible pulmonary nodule or metastatic disease. Emphysematous disease is noted bilaterally. Patchy airspace opacities are noted in the right upper and lower lobes which may represent multifocal pneumonia. Stable 4 mm nodule noted anteriorly in left upper lobe best seen on image number 40 of series 5. Upper Abdomen: No acute abnormality. Musculoskeletal: No chest wall  abnormality. No acute or significant osseous findings. Review of the MIP images confirms the above findings. IMPRESSION: 19 x 14 mm irregular spiculated density is noted in right upper lobe which is significantly enlarged compared to prior exam and highly concerning for primary malignancy. PET scan is recommended for further evaluation. No definite evidence of pulmonary embolus. There is interval development of multiple patchy airspace opacities in the right upper and lower lobes which may represent multifocal pneumonia. Possible 7 mm nodular density seen posteriorly in right lower lobe concerning for possible metastatic disease. Stable 4 mm nodule is noted in left upper lobe compared to prior exam. Coronary artery calcifications are noted. Aortic Atherosclerosis (ICD10-I70.0) and Emphysema (ICD10-J43.9). Electronically Signed   By: Marijo Conception M.D.   On: 06/28/2022 13:01   DG Chest 2 View  Result Date: 06/28/2022 CLINICAL DATA:  67 year old male  with 3 weeks of shortness of breath. EXAM: CHEST - 2 VIEW COMPARISON:  CTA chest 03/28/2022 and earlier. FINDINGS: Seated AP and lateral views at 1159 hours. Centrilobular emphysema demonstrated by CTA last year. Lung volumes and mediastinal contours appear stable and within normal limits, but there is increased perihilar and right lower lung reticulonodular opacity on the AP view, probably in the lower lobe on the lateral. No pneumothorax or pleural effusion. Left lung vascularity appears within normal limits. No acute osseous abnormality identified. Negative visible bowel gas. IMPRESSION: Emphysema [id number]) with acute right lung reticulonodular opacity most suspicious for infectious exacerbation. But note that follow-up of a right mid lung indeterminate nodule/opacity was recommended on December Chest CTA. No pleural effusion. Electronically Signed   By: Genevie Ann M.D.   On: 06/28/2022 12:11      Assessment/Plan Principal Problem:   CAP (community  acquired pneumonia) Active Problems:   Sepsis ([place])   COPD (chronic obstructive pulmonary disease) (HCC)   Hyponatremia   Benign essential hypertension   Lung mass   BPH (benign prostatic hyperplasia)   Nausea vomiting and diarrhea   Tobacco abuse   Alcohol abuse   Hypomagnesemia   Assessment and Plan:  CAP (community acquired pneumonia) and sepsis: pt meets criteria for sepsis with WBC 16.2, heart rate up to 136, RR 23.  CTA negative for PE, but showed enlarged lung mass, which is spiculated and suspicious for malignancy, also showed multiple patchy airspace opacities in the right upper and lower lobes which may represent multifocal pneumonia.  - Will admit to tele bed  PCU as inpt - IV Rocephin and azithromycin - Incentive spirometry - Mucinex for cough  - Bronchodilators - Urine legionella and S. pneumococcal antigen - Follow up blood culture x2, sputum culture - will get Procalcitonin and trend lactic acid level per sepsis protocol - IVF: 2.5 L of NS bolus in ED  COPD (chronic obstructive pulmonary disease) ([place]): No wheezing or rhonchi. -Bronchodilators -Patient received 125 mg of Solu-Medrol in ED, will not continue steroid.  Hyponatremia: Na 129, likely due to potomania and alcohol drinking, dehydration -IV fluid: 2.5 L normal saline -Sodium tablet 1 g 3 times daily  Benign essential hypertension -IV hydralazine as needed -Amlodipine  Lung mass: CTA showed an enlarged spiculated lung mass in RUL, size was 1.2 cm on 03/28/22 --> 19 x 14 mm in size today, highly concerning for primary malignancy. Pt also reports hemoptysis twice. -consulted Dr. Grayland Ormond of oncology  BPH (benign prostatic hyperplasia) -Flomax  Nausea vomiting and diarrhea: -Check C. difficile and GI pathogen panel -start Creon empirically  Tobacco abuse and Alcohol abuse: -Did counseling about importance of quitting smoking and alcohol -Nicotine patch -CIWA protocol  Hypomagnesemia:  Magnesium 1.3, potassium 4.3 -Repleted magnesium with 3 g of magnesium sulfate -Check phosphorus level     DVT ppx: SCD  Code Status: Full code  Family Communication:    Yes, patient's fianc   at bed side.    Disposition Plan:  Anticipate discharge back to previous environment  Consults called: Dr. Grayland Ormond  Admission status and Level of care: Telemetry Medical:    as inpt       Dispo: The patient is from: Home              Anticipated d/c is to: Home              Anticipated d/c date is: 2 days  Patient currently is not medically stable to d/c.    Severity of Illness:  The appropriate patient status for this patient is INPATIENT. Inpatient status is judged to be reasonable and necessary in order to provide the required intensity of service to ensure the patient's safety. The patient's presenting symptoms, physical exam findings, and initial radiographic and laboratory data in the context of their chronic comorbidities is felt to place them at high risk for further clinical deterioration. Furthermore, it is not anticipated that the patient will be medically stable for discharge from the hospital within 2 midnights of admission.   * I certify that at the point of admission it is my clinical judgment that the patient will require inpatient hospital care spanning beyond 2 midnights from the point of admission due to high intensity of service, high risk for further deterioration and high frequency of surveillance required.*       Date of Service 06/28/2022    Ivor Costa [facility]   If 7PM-7AM, please contact night-coverage [email or web address] 06/28/2022, 5:45 PM

## 2022-06-29 DIAGNOSIS — J189 Pneumonia, unspecified organism: Secondary | ICD-10-CM | POA: Diagnosis not present

## 2022-06-29 LAB — BASIC METABOLIC PANEL
Anion gap: 11 (ref 5–15)
BUN: 13 mg/dL (ref 8–23)
CO2: 22 mmol/L (ref 22–32)
Calcium: 8.9 mg/dL (ref 8.9–10.3)
Chloride: 106 mmol/L (ref 98–111)
Creatinine, Ser: 0.77 mg/dL (ref 0.61–1.24)
GFR, Estimated: >60 mL/min (ref >60)
Glucose, Bld: 174 mg/dL — ABNORMAL HIGH (ref 70–99)
Potassium: 3.1 mmol/L — ABNORMAL LOW (ref 3.5–5.1)
Sodium: 139 mmol/L (ref 135–145)

## 2022-06-29 LAB — EXPECTORATED SPUTUM ASSESSMENT W GRAM STAIN, RFLX TO RESP C

## 2022-06-29 LAB — CBC
HCT: 38.6 % — ABNORMAL LOW (ref 39.0–52.0)
Hemoglobin: 14.5 g/dL (ref 13.0–17.0)
MCH: 35.7 pg — ABNORMAL HIGH (ref 26.0–34.0)
MCHC: 37.6 g/dL — ABNORMAL HIGH (ref 30.0–36.0)
MCV: 95.1 fL (ref 80.0–100.0)
Platelets: 164 10*3/uL (ref 150–400)
RBC: 4.06 MIL/uL — ABNORMAL LOW (ref 4.22–5.81)
RDW: 12.5 % (ref 11.5–15.5)
WBC: 13.4 10*3/uL — ABNORMAL HIGH (ref 4.0–10.5)
nRBC: 0 % (ref 0.0–0.2)

## 2022-06-29 MED ORDER — POTASSIUM CHLORIDE CRYS ER 20 MEQ PO TBCR
40.0000 meq | EXTENDED_RELEASE_TABLET | ORAL | Status: AC
  Administered 2022-06-29 (×2): 40 meq via ORAL
  Filled 2022-06-29 (×2): qty 2

## 2022-06-29 MED ORDER — AZITHROMYCIN 250 MG PO TABS
500.0000 mg | ORAL_TABLET | Freq: Every day | ORAL | Status: DC
  Administered 2022-06-29 – 2022-06-30 (×2): 500 mg via ORAL
  Filled 2022-06-29 (×2): qty 2

## 2022-06-29 MED ORDER — UMECLIDINIUM-VILANTEROL 62.5-25 MCG/ACT IN AEPB
1.0000 | INHALATION_SPRAY | Freq: Every day | RESPIRATORY_TRACT | Status: DC
  Administered 2022-06-29 – 2022-06-30 (×2): 1 via RESPIRATORY_TRACT
  Filled 2022-06-29: qty 14

## 2022-06-29 NOTE — Progress Notes (Signed)
PHARMACIST - PHYSICIAN COMMUNICATION DR:   Alfonse Spruce CONCERNING: Antibiotic IV to Oral Route Change Policy  RECOMMENDATION: This patient is receiving azithromycin by the intravenous route.  Based on criteria approved by the Pharmacy and Therapeutics Committee, the antibiotic is being converted to the equivalent oral dose form: azithromycin 500mg  every 24 hours.    DESCRIPTION: These criteria include: Patient being treated for a respiratory tract infection, urinary tract infection, cellulitis or clostridium difficile associated diarrhea if on metronidazole The patient is not neutropenic and does not exhibit a GI malabsorption state The patient is eating (either orally or via tube) and/or has been taking other orally administered medications for a least 24 hours The patient is improving clinically and has a Tmax < 100.5   Glean Salvo, PharmD, BCPS Clinical Pharmacist  06/29/2022 1:54 PM

## 2022-06-29 NOTE — Progress Notes (Signed)
Progress Note   Patient: David Clark [id number] DOB: [birth year] DOA: 06/28/2022     1 DOS: the patient was seen and examined on 06/29/2022    subjective: Patient admits to improvement in his respiratory function Denies chest pain nausea vomiting or abdominal pain .  Seen by hematologist with recommendation to have outpatient follow-up  Brief hospital course: LEONARDO KOOB is a 67 y.o. male with medical history significant of HTN, HLD, COPD, BPH, HBV, lung nodule, tobacco and alcohol abuse, who presents with SOB.  Patient now found to have multifocal pneumonia. Also been right upper lung nodule suspicious for malignancy.  Been seen by hematologist who recommended PET scan at discharge and follow-up with cancer center     Assessment and Plan:  CAP (community acquired pneumonia) and sepsis: pt met criteria for sepsis with WBC 16.2, heart rate up to 136, RR 23.  CTA negative for PE, but showed enlarged lung mass, which is spiculated and suspicious for malignancy, also showed multiple patchy airspace opacities in the right upper and lower lobes which may represent multifocal pneumonia.  - IV Rocephin and azithromycin - Incentive spirometry - Mucinex for cough  - Bronchodilators -Follow-up urine legionella and S. pneumococcal antigen - Follow up blood culture x2, sputum culture    COPD (chronic obstructive pulmonary disease) ([place]): No wheezing or rhonchi. -Bronchodilators -Patient received 125 mg of Solu-Medrol in ED, will not continue steroid.   Hyponatremia: Na 129, likely due to potomania and alcohol drinking, dehydration -IV fluid: 2.5 L normal saline -Sodium tablet 1 g 3 times daily   Benign essential hypertension -IV hydralazine as needed -Amlodipine   Lung mass: CTA showed an enlarged spiculated lung mass in RUL, size was 1.2 cm on 03/28/22 --> 19 x 14 mm in size today, highly concerning for primary malignancy. Pt also reports hemoptysis twice. -consulted Dr.  Grayland Ormond of oncology Pathologists seen patient and recommended PET scan at discharge and follow-up with cancer center  BPH (benign prostatic hyperplasia) -Flomax   Nausea vomiting and diarrhea: Improved C. difficile and GI panel was ordered by admitting provider however details not collected   Tobacco abuse and Alcohol abuse: -Did counseling about importance of quitting smoking and alcohol -Nicotine patch -CIWA protocol   Hypomagnesemia: Magnesium 1.3, potassium 4.3 presentation -Continue repletion and monitoring       DVT ppx: SCD   Code Status: Full code   Family Communication:    Yes, patient's fianc   at bed side.     Disposition Plan:  Anticipate discharge back to previous environment   Consults called: Dr. Grayland Ormond   Physical Exam: Heme: No neck lymph node enlargement. Cardiac: S1/S2, RRR, No murmurs, No gallops or rubs. Respiratory: Slightly decreased air movement bilaterally, no wheezing or rhonchi.Marland Kitchen GI: Soft, nondistended, nontender,  GU: No hematuria Ext: No pitting leg edema bilaterally. 1+DP/PT pulse bilaterally. Musculoskeletal: No joint deformities, No joint redness or warmth,  Skin: No rashes.  Neuro: Alert, oriented X3, cranial nerves II-XII grossly intact, moves all extremities normally.  Psych: Patient is not psychotic, no suicidal or hemocidal ideation.    Vitals:   06/29/22 0100 06/29/22 0504 06/29/22 0830 06/29/22 0910  BP: 112/80 121/80 121/80 119/74  Pulse: 95 (!) 102 97 98  Resp: 20 20  18   Temp: 98.1 F (36.7 C) 98.7 F (37.1 C)  98.2 F (36.8 C)  TempSrc:  Oral  Oral  SpO2: 91% 93%  94%     Time spent: 35 minutes  Author: Alfonse Spruce  Stephania Fragmin, MD 06/29/2022 1:56 PM  For on call review [email or web address].

## 2022-06-29 NOTE — TOC Initial Note (Signed)
Transition of Care [facility]) - Initial/Assessment Note    Patient Details  Name: DIONTE FULLENKAMP MRN: [id number] Date of Birth: 11/15/1955  Transition of Care [facility]) CM/SW Contact:    Candie Chroman, LCSW Phone Number: 06/29/2022, 4:11 PM  Clinical Narrative:  CSW acknowledges SA consult. Per TOC handoff from the weekend, resources were added to AVS.                Expected Discharge Plan: Home/Self Care     Patient Goals and CMS Choice            Expected Discharge Plan and Services     Post Acute Care Choice: NA Living arrangements for the past 2 months: Single Family Home                                      Prior Living Arrangements/Services Living arrangements for the past 2 months: [place]   Patient language and need for interpreter reviewed:: Yes Do you feel safe going back to the place where you live?: Yes            Criminal Activity/Legal Involvement Pertinent to Current Situation/Hospitalization: No - Comment as needed  Activities of Daily Living      Permission Sought/Granted                  Emotional Assessment       Orientation: : Oriented to Self, Oriented to Place, Oriented to  Time, Oriented to Situation Alcohol / Substance Use: Alcohol Use, Tobacco Use Psych Involvement: No (comment)  Admission diagnosis:  Lung mass [R91.8] CAP (community acquired pneumonia) [J18.9] Pneumonia due to infectious organism, unspecified laterality, unspecified part of lung [J18.9] Chronic obstructive pulmonary disease, unspecified COPD type ([place]) [J44.9] Sepsis, due to unspecified organism, unspecified whether acute organ dysfunction present [facility]) [A41.9] Patient Active Problem List   Diagnosis Date Noted   Sepsis ([place]) 06/28/2022   Hyponatremia 06/28/2022   Nausea vomiting and diarrhea 06/28/2022   Lung mass 06/28/2022   Hypertension 06/28/2022   Tobacco abuse 06/28/2022   Alcohol abuse 06/28/2022   Hypomagnesemia 06/28/2022    BPH (benign prostatic hyperplasia) 03/29/2022   CAP (community acquired pneumonia) 03/28/2022   Pulmonary nodule 03/28/2022   COPD (chronic obstructive pulmonary disease) ([place]) 03/28/2022   Sinus tachycardia 03/28/2022   Family history of prostate cancer 02/23/2022   Cervical disc disease 02/19/2021   Hyperlipidemia, mixed 02/19/2020   Squamous cell carcinoma, arm, left 02/19/2020   Benign essential hypertension 02/17/2019   PCP:  Rusty Aus, MD Pharmacy:   [facility] [address]), [place]) [place] [address] Phone: [phone] Fax: [phone]     Social Determinants of Health (SDOH) Social History: SDOH Screenings   Food Insecurity: No Food Insecurity (06/28/2022)  Housing: Low Risk  (06/28/2022)  Transportation Needs: No Transportation Needs (06/28/2022)  Utilities: Not At Risk (06/28/2022)  Tobacco Use: High Risk (06/28/2022)   SDOH Interventions:     Readmission Risk Interventions     No data to display

## 2022-06-30 DIAGNOSIS — R918 Other nonspecific abnormal finding of lung field: Secondary | ICD-10-CM | POA: Diagnosis not present

## 2022-06-30 LAB — CBC WITH DIFFERENTIAL/PLATELET
Abs Immature Granulocytes: 0.07 10*3/uL (ref 0.00–0.07)
Basophils Absolute: 0 10*3/uL (ref 0.0–0.1)
Basophils Relative: 0 %
Eosinophils Absolute: 0.1 10*3/uL (ref 0.0–0.5)
Eosinophils Relative: 1 %
HCT: 38.4 % — ABNORMAL LOW (ref 39.0–52.0)
Hemoglobin: 14.1 g/dL (ref 13.0–17.0)
Immature Granulocytes: 1 %
Lymphocytes Relative: 12 %
Lymphs Abs: 1.3 10*3/uL (ref 0.7–4.0)
MCH: 35.1 pg — ABNORMAL HIGH (ref 26.0–34.0)
MCHC: 36.7 g/dL — ABNORMAL HIGH (ref 30.0–36.0)
MCV: 95.5 fL (ref 80.0–100.0)
Monocytes Absolute: 0.4 10*3/uL (ref 0.1–1.0)
Monocytes Relative: 4 %
Neutro Abs: 8.9 10*3/uL — ABNORMAL HIGH (ref 1.7–7.7)
Neutrophils Relative %: 82 %
Platelets: 149 10*3/uL — ABNORMAL LOW (ref 150–400)
RBC: 3.92 MIL/uL — ABNORMAL LOW (ref 4.22–5.81)
RDW: 12.9 % (ref 11.5–15.5)
WBC: 10.7 10*3/uL — ABNORMAL HIGH (ref 4.0–10.5)
nRBC: 0 % (ref 0.0–0.2)

## 2022-06-30 LAB — LEGIONELLA PNEUMOPHILA SEROGP 1 UR AG: L. pneumophila Serogp 1 Ur Ag: NEGATIVE

## 2022-06-30 LAB — BASIC METABOLIC PANEL
Anion gap: 7 (ref 5–15)
BUN: 18 mg/dL (ref 8–23)
CO2: 24 mmol/L (ref 22–32)
Calcium: 8.6 mg/dL — ABNORMAL LOW (ref 8.9–10.3)
Chloride: 109 mmol/L (ref 98–111)
Creatinine, Ser: 0.79 mg/dL (ref 0.61–1.24)
GFR, Estimated: >60 mL/min (ref >60)
Glucose, Bld: 118 mg/dL — ABNORMAL HIGH (ref 70–99)
Potassium: 3.6 mmol/L (ref 3.5–5.1)
Sodium: 140 mmol/L (ref 135–145)

## 2022-06-30 MED ORDER — AMOXICILLIN 875 MG PO TABS: 875.0000 mg | ORAL_TABLET | Freq: Two times a day (BID) | ORAL | 0 refills | Status: AC

## 2022-06-30 MED ORDER — FOLIC ACID 1 MG PO TABS: 1.0000 mg | ORAL_TABLET | Freq: Every day | ORAL | 0 refills | Status: AC

## 2022-06-30 MED ORDER — ADULT MULTIVITAMIN W/MINERALS CH: 1.0000 | ORAL_TABLET | Freq: Every day | ORAL | 0 refills | Status: DC

## 2022-06-30 MED ORDER — VITAMIN B-1 100 MG PO TABS: 100.0000 mg | ORAL_TABLET | Freq: Every day | ORAL | 0 refills | Status: AC

## 2022-06-30 NOTE — Discharge Summary (Signed)
Physician Discharge Summary   Patient: David Clark MRN: [id number] DOB: 08-11-1955  Admit date:     06/28/2022  Discharge date: 06/30/22  Discharge Physician: Verline Lema   PCP: Rusty Aus, MD     Discharge Diagnoses: CAP (community acquired pneumonia) and sepsis:  COPD (chronic obstructive pulmonary disease) ([place]): No wheezing or rhonchi. Hyponatremia:  Benign essential hypertension Lung mass: Suspicious for malignancy  BPH (benign prostatic hyperplasia) Nausea vomiting and diarrhea: Tobacco abuse and Alcohol abuse: Hypomagnesemia:       Hospital Course: David Clark is a 67 y.o. male with medical history significant of HTN, HLD, COPD, BPH, HBV, lung nodule, tobacco and alcohol abuse, who presents with SOB.  Patient now found to have multifocal pneumonia. Also been right upper lung nodule suspicious for malignancy.  Been seen by hematologist who recommended PET scan at discharge and follow-up with cancer center.  Patient is doing much better today not requiring oxygen therapy to move about with no complaints and therefore being discharged today to follow-up with oncologist.    Consultants: Hematologist Procedures performed: None Disposition: Home Diet recommendation:  Discharge Diet Orders (From admission, onward)     Start     Ordered   06/30/22 0000  Diet - low sodium heart healthy        06/30/22 1219           Cardiac diet DISCHARGE MEDICATION: Allergies as of 06/30/2022   No Known Allergies      Medication List     TAKE these medications    albuterol 108 (90 Base) MCG/ACT inhaler Commonly known as: VENTOLIN HFA Inhale 2 puffs into the lungs every 6 (six) hours as needed for wheezing or shortness of breath.   amLODipine 5 MG tablet Commonly known as: NORVASC Take 5 mg by mouth at bedtime.   amoxicillin 875 MG tablet Commonly known as: AMOXIL Take 1 tablet (875 mg total) by mouth 2 (two) times daily for 7 days.   colchicine 0.6 MG  tablet Take 0.6 mg by mouth 2 (two) times daily as needed (gout flare).   folic acid 1 MG tablet Commonly known as: FOLVITE Take 1 tablet (1 mg total) by mouth daily. Start taking on: July 01, 2022   multivitamin with minerals Tabs tablet Take 1 tablet by mouth daily. Start taking on: July 01, 2022   omeprazole 20 MG capsule Commonly known as: PRILOSEC Take 20 mg by mouth daily.   tamsulosin 0.4 MG Caps capsule Commonly known as: FLOMAX Take 0.4 mg by mouth daily.   thiamine 100 MG tablet Commonly known as: Vitamin B-1 Take 1 tablet (100 mg total) by mouth daily. Start taking on: July 01, 2022   umeclidinium-vilanterol 62.5-25 MCG/ACT Aepb Commonly known as: ANORO ELLIPTA Inhale 1 puff into the lungs daily.        Follow-up Information     Lloyd Huger, MD. Schedule an appointment as soon as possible for a visit.   Specialty: Oncology Contact information: [place] [address] [phone]                Discharge Exam: Heme: No neck lymph node enlargement. Cardiac: S1/S2, RRR, No murmurs, No gallops or rubs. Respiratory: no wheezing or rhonchi.Marland Kitchen GI: Soft, nondistended, nontender,  GU: No hematuria Ext: No pitting leg edema bilaterally. 1+DP/PT pulse bilaterally. Musculoskeletal: No joint deformities, No joint redness or warmth,  Skin: No rashes.  Neuro: Alert, oriented X3, cranial nerves II-XII grossly  intact, moves all extremities normally.  Psych: Patient is not psychotic, no suicidal or hemocidal ideation.    Condition at discharge: good  Discharge time spent: greater than 30 minutes.  Signed: Verline Lema, MD [facility] 06/30/2022

## 2022-07-01 ENCOUNTER — Telehealth: Payer: Self-pay

## 2022-07-01 ENCOUNTER — Other Ambulatory Visit: Payer: Self-pay

## 2022-07-01 DIAGNOSIS — R918 Other nonspecific abnormal finding of lung field: Secondary | ICD-10-CM

## 2022-07-01 NOTE — Telephone Encounter (Signed)
David Clark has been discharged from the hospital. PET and hospital follow arranged with Dr. Grayland Ormond.

## 2022-07-02 LAB — CULTURE, RESPIRATORY W GRAM STAIN
Culture: NORMAL
Gram Stain: NONE SEEN

## 2022-07-03 LAB — CULTURE, BLOOD (ROUTINE X 2)
Culture: NO GROWTH
Culture: NO GROWTH
Special Requests: ADEQUATE

## 2022-07-06 ENCOUNTER — Other Ambulatory Visit: Payer: PPO

## 2022-07-08 ENCOUNTER — Ambulatory Visit
Admission: RE | Admit: 2022-07-08 | Discharge: 2022-07-08 | Disposition: A | Discharge status: Home / Self Care | Payer: PPO | Source: Ambulatory Visit | Attending: Oncology | Admitting: Oncology

## 2022-07-08 DIAGNOSIS — R918 Other nonspecific abnormal finding of lung field: Secondary | ICD-10-CM | POA: Diagnosis not present

## 2022-07-08 DIAGNOSIS — J984 Other disorders of lung: Secondary | ICD-10-CM | POA: Insufficient documentation

## 2022-07-08 DIAGNOSIS — I700 Atherosclerosis of aorta: Secondary | ICD-10-CM | POA: Diagnosis not present

## 2022-07-08 LAB — GLUCOSE, CAPILLARY: Glucose-Capillary: 109 mg/dL — ABNORMAL HIGH (ref 70–99)

## 2022-07-08 MED ORDER — FLUDEOXYGLUCOSE F - 18 (FDG) INJECTION
9.7000 | Freq: Once | INTRAVENOUS | Status: AC | PRN
  Administered 2022-07-08: 10.29 via INTRAVENOUS

## 2022-07-09 DIAGNOSIS — J189 Pneumonia, unspecified organism: Secondary | ICD-10-CM | POA: Diagnosis not present

## 2022-07-10 ENCOUNTER — Encounter: Payer: Self-pay | Admitting: Oncology

## 2022-07-10 ENCOUNTER — Encounter: Payer: Self-pay | Admitting: *Deleted

## 2022-07-10 ENCOUNTER — Inpatient Hospital Stay: Payer: PPO | Attending: Oncology | Admitting: Oncology

## 2022-07-10 VITALS — BP 140/90 | HR 94 | Temp 96.7°F | Resp 16 | Ht 72.0 in | Wt 193.8 lb

## 2022-07-10 DIAGNOSIS — R918 Other nonspecific abnormal finding of lung field: Secondary | ICD-10-CM | POA: Diagnosis not present

## 2022-07-10 DIAGNOSIS — C3411 Malignant neoplasm of upper lobe, right bronchus or lung: Secondary | ICD-10-CM | POA: Diagnosis not present

## 2022-07-10 DIAGNOSIS — Z79899 Other long term (current) drug therapy: Secondary | ICD-10-CM | POA: Insufficient documentation

## 2022-07-10 NOTE — Progress Notes (Signed)
Met with patient during hospital follow up visit with Dr. Orlie Dakin to review recent PET scan results. All questions answered during visit. Informed pt that will schedule him for follow up once his bronchoscopy with Dr. Karna Christmas has been scheduled. Pt states that he works Mon-Fri from 8am-1pm and needs his appts scheduled around his work schedule since he does not have any more vacation/sick time available. Contact info given to pt and instructed to call with any questions or needs. Pt verbalized understanding. Nothing further needed at this time.

## 2022-07-10 NOTE — Progress Notes (Signed)
[facility]  Telephone:(336) [phone] Fax:(336) [phone]  ID: David Clark OB: 28-Sep-1955  MR#: [id number]  AOZ#:[id number]#:[id number]  Patient Care Team: Danella PentonMiller, Mark F, MD as PCP - General (Internal Medicine) Glory Buffhode, Hayley, RN as Oncology Nurse Navigator  CHIEF COMPLAINT: PET positive right upper lobe pulmonary nodule.  INTERVAL HISTORY: Patient returns to clinic today for hospital follow-up to discuss his imaging results and additional diagnostic planning.  He feels significantly improved and back to his baseline.  He has no neurologic complaints.  He denies any recent fevers.  He has a good appetite and denies weight loss.  He has no further chest pain, shortness of breath, cough, or hemoptysis.  He denies any nausea, vomiting, constipation, or diarrhea.  He has no urinary complaints.  Patient offers no specific complaints today.  REVIEW OF SYSTEMS:   Review of Systems  Constitutional: Negative.  Negative for fever, malaise/fatigue and weight loss.  Respiratory: Negative.  Negative for cough, hemoptysis and shortness of breath.   Cardiovascular: Negative.  Negative for chest pain and leg swelling.  Gastrointestinal: Negative.  Negative for abdominal pain.  Genitourinary: Negative.  Negative for frequency.  Musculoskeletal: Negative.  Negative for back pain.  Skin: Negative.  Negative for rash.  Neurological: Negative.  Negative for dizziness, focal weakness, weakness and headaches.  Psychiatric/Behavioral: Negative.  The patient is not nervous/anxious.     As per HPI. Otherwise, a complete review of systems is negative.  PAST MEDICAL HISTORY: Past Medical History:  Diagnosis Date   Arthritis    COPD (chronic obstructive pulmonary disease)    GERD (gastroesophageal reflux disease)    Hepatitis B 1993   Hypertension    Squamous cell carcinoma, arm, left     PAST SURGICAL HISTORY: Past Surgical History:  Procedure Laterality Date   ANTERIOR CERVICAL  DECOMP/DISCECTOMY FUSION N/A 01/01/2021   Procedure: C5-6 ANTERIOR CERVICAL DECOMPRESSION/DISCECTOMY FUSION 1 LEVEL;  Surgeon: Venetia NightYarbrough, Chester, MD;  Location: [facility] ORS;  Service: Neurosurgery;  Laterality: N/A;   NO PAST SURGERIES      FAMILY HISTORY: Family History  Problem Relation Age of Onset   Diabetes type II Mother    COPD Mother    Heart attack Mother    Heart attack Father    Dementia Father    Prostate cancer Father    Heart disease Sister     ADVANCED DIRECTIVES (Y/N):  N  HEALTH MAINTENANCE: Social History   Tobacco Use   Smoking status: Some Days    Types: Cigarettes    Last attempt to quit: 1992    Years since quitting: 32.2   Smokeless tobacco: Current    Types: Snuff  Vaping Use   Vaping Use: Never used  Substance Use Topics   Alcohol use: Yes    Comment: occassional   Drug use: Not Currently     Colonoscopy:  PAP:  Bone density:  Lipid panel:  No Known Allergies  Current Outpatient Medications  Medication Sig Dispense Refill   albuterol (VENTOLIN HFA) 108 (90 Base) MCG/ACT inhaler Inhale 2 puffs into the lungs every 6 (six) hours as needed for wheezing or shortness of breath. 18 g 0   amLODipine (NORVASC) 5 MG tablet Take 5 mg by mouth at bedtime.     colchicine 0.6 MG tablet Take 0.6 mg by mouth 2 (two) times daily as needed (gout flare).     folic acid (FOLVITE) 1 MG tablet Take 1 tablet (1 mg total) by mouth daily. 30 tablet 0  omeprazole (PRILOSEC) 20 MG capsule Take 20 mg by mouth daily.     tamsulosin (FLOMAX) 0.4 MG CAPS capsule Take 0.4 mg by mouth daily.     thiamine (VITAMIN B-1) 100 MG tablet Take 1 tablet (100 mg total) by mouth daily. 30 tablet 0   umeclidinium-vilanterol (ANORO ELLIPTA) 62.5-25 MCG/ACT AEPB Inhale 1 puff into the lungs daily. 60 each 0   Multiple Vitamin (MULTIVITAMIN WITH MINERALS) TABS tablet Take 1 tablet by mouth daily. (Patient not taking: Reported on 07/10/2022) 30 tablet 0   No current  facility-administered medications for this visit.    OBJECTIVE: Vitals:   07/10/22 1027  BP: (!) 140/90  Pulse: 94  Resp: 16  Temp: (!) 96.7 F (35.9 C)  SpO2: 95%     Body mass index is 26.28 kg/m.    ECOG FS:0 - Asymptomatic  General: Well-developed, well-nourished, no acute distress. Eyes: Pink conjunctiva, anicteric sclera. HEENT: Normocephalic, moist mucous membranes. Lungs: No audible wheezing or coughing. Heart: Regular rate and rhythm. Abdomen: Soft, nontender, no obvious distention. Musculoskeletal: No edema, cyanosis, or clubbing. Neuro: Alert, answering all questions appropriately. Cranial nerves grossly intact. Skin: No rashes or petechiae noted. Psych: Normal affect.  LAB RESULTS:  Lab Results  Component Value Date   NA 140 06/30/2022   K 3.6 06/30/2022   CL 109 06/30/2022   CO2 24 06/30/2022   GLUCOSE 118 (H) 06/30/2022   BUN 18 06/30/2022   CREATININE 0.79 06/30/2022   CALCIUM 8.6 (L) 06/30/2022   PROT 7.8 06/28/2022   ALBUMIN 4.4 06/28/2022   AST 47 (H) 06/28/2022   ALT 34 06/28/2022   ALKPHOS 73 06/28/2022   BILITOT 2.3 (H) 06/28/2022   GFRNONAA >60 06/30/2022    Lab Results  Component Value Date   WBC 10.7 (H) 06/30/2022   NEUTROABS 8.9 (H) 06/30/2022   HGB 14.1 06/30/2022   HCT 38.4 (L) 06/30/2022   MCV 95.5 06/30/2022   PLT 149 (L) 06/30/2022     STUDIES: NM PET Image Initial (PI) Skull Base To Thigh  Result Date: 07/09/2022 CLINICAL DATA:  Initial treatment strategy for enlarging right upper lobe lung lesion. EXAM: NUCLEAR MEDICINE PET SKULL BASE TO THIGH TECHNIQUE: 10.29 mCi F-18 FDG was injected intravenously. Full-ring PET imaging was performed from the skull base to thigh after the radiotracer. CT data was obtained and used for attenuation correction and anatomic localization. Fasting blood glucose: 109 mg/dl COMPARISON:  Prior chest CT scans from 03/28/2022 and 06/28/2022 FINDINGS: Mediastinal blood pool activity: SUV max 1.75  Liver activity: SUV max NA NECK: No hypermetabolic lymph nodes in the neck. Incidental CT findings: Bilateral carotid artery calcifications. CHEST: The enlarging subsolid right upper lobe pulmonary lesion measuring a maximum of 2.8 cm demonstrates hypermetabolism. This is most notably in the more solid component medially. SUV max is 3.77. Findings suspicious for adenocarcinoma. Small focus of hypermetabolism (SUV max 2.81) in the right lower lobe peripherally. This corresponds with a subpleural area of probable post pneumonic inflammatory change. I do not see a discrete lesion. 8 mm subpleural nodule in the superior segment of the right lower lobe on image number 59/4 has an SUV max of 1.38 and is indeterminate. 7 mm right upper lobe medially on image number 47/4 has an SUV max of 2.41 and is somewhat suspicious. Recommend continued close observation. No enlarged or hypermetabolic mediastinal or hilar lymph nodes. No supraclavicular or axillary adenopathy. Incidental CT findings: Most of the inflammatory/infectious changes seen on the most recent chest  CT have resolved. Stable significant underlying emphysematous changes and pulmonary scarring. Stable 3 mm left upper lobe nodule on image number 51/4. Stable aortic and coronary artery calcifications. ABDOMEN/PELVIS: No abnormal hypermetabolic activity within the liver, pancreas, adrenal glands, or spleen. No hypermetabolic lymph nodes in the abdomen or pelvis. Incidental CT findings: Mild splenomegaly but no splenic hypermetabolism or splenic lesions. No obvious changes of cirrhosis. Age advanced atherosclerotic calcifications involving the aorta and iliac arteries. SKELETON: No focal hypermetabolic activity to suggest skeletal metastasis. Incidental CT findings: None. IMPRESSION: 1. The enlarging subsolid right upper lobe pulmonary lesion is hypermetabolic and suspicious for adenocarcinoma. 2. Two small right lung nodules are indeterminate. Recommend continued close  observation. 3. No enlarged or hypermetabolic mediastinal or hilar lymph nodes. 4. No findings for metastatic disease involving the abdomen/pelvis or bony structures. Aortic Atherosclerosis (ICD10-I70.0). Electronically Signed   By: Rudie Meyer M.D.   On: 07/09/2022 11:39   CT Angio Chest PE W and/or Wo Contrast  Result Date: 06/28/2022 CLINICAL DATA:  Shortness of breath. EXAM: CT ANGIOGRAPHY CHEST WITH CONTRAST TECHNIQUE: Multidetector CT imaging of the chest was performed using the standard protocol during bolus administration of intravenous contrast. Multiplanar CT image reconstructions and MIPs were obtained to evaluate the vascular anatomy. RADIATION DOSE REDUCTION: This exam was performed according to the departmental dose-optimization program which includes automated exposure control, adjustment of the mA and/or kV according to patient size and/or use of iterative reconstruction technique. CONTRAST:  75mL OMNIPAQUE IOHEXOL 350 MG/ML SOLN COMPARISON:  March 28, 2022. FINDINGS: Cardiovascular: Satisfactory opacification of the pulmonary arteries to the segmental level. No evidence of pulmonary embolism. Normal heart size. No pericardial effusion. Coronary artery calcifications are noted. Mediastinum/Nodes: No enlarged mediastinal, hilar, or axillary lymph nodes. Thyroid gland, trachea, and esophagus demonstrate no significant findings. Lungs/Pleura: No pneumothorax or pleural effusion is noted. 19 x 14 mm irregular spiculated density is noted in right upper lobe best seen on image number 137 of series 8. This is significantly enlarged compared to prior exam and is highly concerning for malignancy. 7 mm nodular density is noted posteriorly in right lower lobe best seen on image number 85 of series 5, concerning for possible pulmonary nodule or metastatic disease. Emphysematous disease is noted bilaterally. Patchy airspace opacities are noted in the right upper and lower lobes which may represent  multifocal pneumonia. Stable 4 mm nodule noted anteriorly in left upper lobe best seen on image number 40 of series 5. Upper Abdomen: No acute abnormality. Musculoskeletal: No chest wall abnormality. No acute or significant osseous findings. Review of the MIP images confirms the above findings. IMPRESSION: 19 x 14 mm irregular spiculated density is noted in right upper lobe which is significantly enlarged compared to prior exam and highly concerning for primary malignancy. PET scan is recommended for further evaluation. No definite evidence of pulmonary embolus. There is interval development of multiple patchy airspace opacities in the right upper and lower lobes which may represent multifocal pneumonia. Possible 7 mm nodular density seen posteriorly in right lower lobe concerning for possible metastatic disease. Stable 4 mm nodule is noted in left upper lobe compared to prior exam. Coronary artery calcifications are noted. Aortic Atherosclerosis (ICD10-I70.0) and Emphysema (ICD10-J43.9). Electronically Signed   By: Lupita Raider M.D.   On: 06/28/2022 13:01   DG Chest 2 View  Result Date: 06/28/2022 CLINICAL DATA:  67 year old male with 3 weeks of shortness of breath. EXAM: CHEST - 2 VIEW COMPARISON:  CTA chest 03/28/2022 and earlier.  FINDINGS: Seated AP and lateral views at 1159 hours. Centrilobular emphysema demonstrated by CTA last year. Lung volumes and mediastinal contours appear stable and within normal limits, but there is increased perihilar and right lower lung reticulonodular opacity on the AP view, probably in the lower lobe on the lateral. No pneumothorax or pleural effusion. Left lung vascularity appears within normal limits. No acute osseous abnormality identified. Negative visible bowel gas. IMPRESSION: Emphysema ([id number](ICD10-J43.9) with acute right lung reticulonodular opacity most suspicious for infectious exacerbation. But note that follow-up of a right mid lung indeterminate nodule/opacity was  recommended on December Chest CTA. No pleural effusion. Electronically Signed   By: Odessa FlemingH  Hall M.D.   On: 06/28/2022 12:11    ASSESSMENT: PET positive right upper lobe pulmonary nodule.  PLAN:    PET positive right upper lobe pulmonary nodule: Highly suspicious for underlying malignancy.  CT scan from December 2023 revealed a nodule 1.4 cm, repeat scan recently revealed nodule had increased to 1.9 cm.  Patient has consultation with pulmonary at the end of the month to discuss bronchoscopy and biopsy.  Given this is likely stage I disease, patient may be a surgical candidate.  Return to clinic 1 week after his biopsy to discuss the results and treatment planning.    I spent a total of 30 minutes reviewing chart data, face-to-face evaluation with the patient, counseling and coordination of care as detailed above.  Patient expressed understanding and was in agreement with this plan. He also understands that He can call clinic at any time with any questions, concerns, or complaints.    Cancer Staging  No matching staging information was found for the patient.  Jeralyn Ruthsimothy J Shylynn Bruning, MD   07/10/2022 1:05 PM

## 2022-07-14 DIAGNOSIS — C3491 Malignant neoplasm of unspecified part of right bronchus or lung: Secondary | ICD-10-CM | POA: Diagnosis not present

## 2022-07-14 DIAGNOSIS — R911 Solitary pulmonary nodule: Secondary | ICD-10-CM | POA: Diagnosis not present

## 2022-07-16 DIAGNOSIS — R0602 Shortness of breath: Secondary | ICD-10-CM | POA: Diagnosis not present

## 2022-07-16 LAB — PULMONARY FUNCTION TEST

## 2022-07-20 ENCOUNTER — Encounter
Admission: RE | Admit: 2022-07-20 | Discharge: 2022-07-20 | Disposition: A | Discharge status: Home / Self Care | Payer: PPO | Source: Ambulatory Visit | Attending: Pulmonary Disease | Admitting: Pulmonary Disease

## 2022-07-20 ENCOUNTER — Other Ambulatory Visit: Payer: Self-pay

## 2022-07-20 ENCOUNTER — Other Ambulatory Visit: Payer: Self-pay | Admitting: Pulmonary Disease

## 2022-07-20 VITALS — Ht 72.0 in | Wt 193.0 lb

## 2022-07-20 DIAGNOSIS — E782 Mixed hyperlipidemia: Secondary | ICD-10-CM

## 2022-07-20 DIAGNOSIS — R000 Tachycardia, unspecified: Secondary | ICD-10-CM

## 2022-07-20 DIAGNOSIS — Z7901 Long term (current) use of anticoagulants: Secondary | ICD-10-CM

## 2022-07-20 DIAGNOSIS — I10 Essential (primary) hypertension: Secondary | ICD-10-CM

## 2022-07-20 DIAGNOSIS — R911 Solitary pulmonary nodule: Secondary | ICD-10-CM

## 2022-07-20 DIAGNOSIS — R0602 Shortness of breath: Secondary | ICD-10-CM

## 2022-07-20 HISTORY — DX: Hypo-osmolality and hyponatremia: E87.1

## 2022-07-20 HISTORY — DX: Hyperlipidemia, unspecified: E78.5

## 2022-07-20 HISTORY — DX: Sepsis, unspecified organism: A41.9

## 2022-07-20 HISTORY — DX: Cervical disc disorder, unspecified, unspecified cervical region: M50.90

## 2022-07-20 HISTORY — DX: Benign prostatic hyperplasia without lower urinary tract symptoms: N40.0

## 2022-07-20 HISTORY — DX: Pneumonia, unspecified organism: J18.9

## 2022-07-20 HISTORY — DX: COVID-19: U07.1

## 2022-07-20 HISTORY — DX: Hypomagnesemia: E83.42

## 2022-07-20 NOTE — Patient Instructions (Addendum)
Your procedure is scheduled on: Friday 07/24/22 To find out your arrival time, please call [phone] between 1PM - 3PM on:   Thursday 07/23/22 Report to the Registration Desk on the 1st floor of the [facility]. Valet parking is available.  If your arrival time is 6:00 am, do not arrive before that time as the [facility] entrance doors do not open until 6:00 am.  REMEMBER: Instructions that are not followed completely may result in serious medical risk, up to and including death; or upon the discretion of your surgeon and anesthesiologist your surgery may need to be rescheduled.  Do not eat food or drink any liquids after midnight the night before surgery.  No gum chewing or hard candies.  One week prior to surgery: Stop Anti-inflammatories (NSAIDS) such as Advil, Aleve, Ibuprofen, Motrin, Naproxen, Naprosyn and Aspirin based products such as Excedrin, Goody's Powder, BC Powder. You may however, continue to take Tylenol if needed for pain up until the day of surgery.  Stop ANY OVER THE COUNTER supplements until after surgery.  Continue taking all prescribed medications with the exception of the following: N/A  TAKE ONLY THESE MEDICATIONS THE MORNING OF SURGERY WITH A SIP OF WATER:  amLODipine (NORVASC) 5 MG tablet  omeprazole (PRILOSEC) 20 MG capsule Antacid (take one the night before and one on the morning of surgery - helps to prevent nausea after surgery.) tamsulosin (FLOMAX) 0.4 MG CAPS capsule   Use inhalers on the day of surgery and bring to the hospital.  No Alcohol for 24 hours before or after surgery.  No Smoking including e-cigarettes for 24 hours before surgery.  No chewable tobacco products for at least 6 hours before surgery.  No nicotine patches on the day of surgery.  Do not use any "recreational" drugs for at least a week (preferably 2 weeks) before your surgery.  Please be advised that the combination of cocaine and anesthesia may have negative outcomes, up  to and including death. If you test positive for cocaine, your surgery will be cancelled.  On the morning of surgery brush your teeth with toothpaste and water, you may rinse your mouth with mouthwash if you wish. Do not swallow any toothpaste or mouthwash.  Use CHG Soap or wipes as directed on instruction sheet. Shower the morning of your procedure with your regular soap.  Do not wear lotions, powders, or perfumes.   Do not shave body hair from the neck down 48 hours before surgery.  Wear comfortable clothing (specific to your surgery type) to the hospital.  Do not wear jewelry, make-up, hairpins, clips or nail polish.  Contact lenses, hearing aids and dentures may not be worn into surgery.  Do not bring valuables to the hospital. [facility] is not responsible for any missing/lost belongings or valuables.   Notify your doctor if there is any change in your medical condition (cold, fever, infection).  If you are being discharged the day of surgery, you will not be allowed to drive home. You will need a responsible individual to drive you home and stay with you for 24 hours after surgery.   If you are taking public transportation, you will need to have a responsible individual with you.  If you are being admitted to the hospital overnight, leave your suitcase in the car. After surgery it may be brought to your room.  In case of increased patient census, it may be necessary for you, the patient, to continue your postoperative care in the Same  Day Surgery department.  After surgery, you can help prevent lung complications by doing breathing exercises.  Take deep breaths and cough every 1-2 hours. Your doctor may order a device called an Incentive Spirometer to help you take deep breaths. When coughing or sneezing, hold a pillow firmly against your incision with both hands. This is called "splinting." Doing this helps protect your incision. It also decreases belly discomfort.  Surgery  Visitation Policy:  Patients undergoing a surgery or procedure may have two family members or support persons with them as long as the person is not COVID-19 positive or experiencing its symptoms.   Inpatient Visitation:    Visiting hours are 7 a.m. to 8 p.m. Up to four visitors are allowed at one time in a patient room. The visitors may rotate out with other people during the day. One designated support person (adult) may remain overnight.  Please call the Pre-admissions Testing Dept. at [phone] if you have any questions about these instructions.

## 2022-07-21 ENCOUNTER — Ambulatory Visit
Admission: RE | Admit: 2022-07-21 | Discharge: 2022-07-21 | Disposition: A | Discharge status: Home / Self Care | Payer: PPO | Source: Ambulatory Visit | Attending: Pulmonary Disease | Admitting: Pulmonary Disease

## 2022-07-21 DIAGNOSIS — J439 Emphysema, unspecified: Secondary | ICD-10-CM | POA: Diagnosis not present

## 2022-07-21 DIAGNOSIS — R0602 Shortness of breath: Secondary | ICD-10-CM | POA: Insufficient documentation

## 2022-07-21 DIAGNOSIS — R911 Solitary pulmonary nodule: Secondary | ICD-10-CM | POA: Diagnosis not present

## 2022-07-21 DIAGNOSIS — R918 Other nonspecific abnormal finding of lung field: Secondary | ICD-10-CM | POA: Diagnosis not present

## 2022-07-22 ENCOUNTER — Encounter
Admission: RE | Admit: 2022-07-22 | Discharge: 2022-07-22 | Disposition: A | Discharge status: Home / Self Care | Payer: PPO | Source: Ambulatory Visit | Attending: Pulmonary Disease | Admitting: Pulmonary Disease

## 2022-07-22 ENCOUNTER — Encounter: Payer: Self-pay | Admitting: Urgent Care

## 2022-07-22 DIAGNOSIS — Z1152 Encounter for screening for COVID-19: Secondary | ICD-10-CM | POA: Diagnosis not present

## 2022-07-22 DIAGNOSIS — Z01818 Encounter for other preprocedural examination: Secondary | ICD-10-CM | POA: Diagnosis not present

## 2022-07-22 DIAGNOSIS — R000 Tachycardia, unspecified: Secondary | ICD-10-CM

## 2022-07-22 DIAGNOSIS — E782 Mixed hyperlipidemia: Secondary | ICD-10-CM | POA: Diagnosis not present

## 2022-07-22 DIAGNOSIS — I10 Essential (primary) hypertension: Secondary | ICD-10-CM | POA: Diagnosis not present

## 2022-07-22 DIAGNOSIS — Z7901 Long term (current) use of anticoagulants: Secondary | ICD-10-CM

## 2022-07-22 DIAGNOSIS — Z01810 Encounter for preprocedural cardiovascular examination: Secondary | ICD-10-CM | POA: Diagnosis not present

## 2022-07-22 LAB — APTT: aPTT: 31 seconds (ref 24–36)

## 2022-07-22 LAB — CBC
HCT: 45.6 % (ref 39.0–52.0)
Hemoglobin: 16.9 g/dL (ref 13.0–17.0)
MCH: 35.9 pg — ABNORMAL HIGH (ref 26.0–34.0)
MCHC: 37.1 g/dL — ABNORMAL HIGH (ref 30.0–36.0)
MCV: 96.8 fL (ref 80.0–100.0)
Platelets: 201 10*3/uL (ref 150–400)
RBC: 4.71 MIL/uL (ref 4.22–5.81)
RDW: 12.8 % (ref 11.5–15.5)
WBC: 6 10*3/uL (ref 4.0–10.5)
nRBC: 0 % (ref 0.0–0.2)

## 2022-07-22 LAB — PROTIME-INR
INR: 1 (ref 0.8–1.2)
Prothrombin Time: 13.5 seconds (ref 11.4–15.2)

## 2022-07-23 LAB — SARS CORONAVIRUS 2 (TAT 6-24 HRS): SARS Coronavirus 2: NEGATIVE

## 2022-07-24 ENCOUNTER — Encounter
Admission: RE | Disposition: A | Discharge status: Home / Self Care | Payer: Self-pay | Source: Home / Self Care | Attending: Pulmonary Disease

## 2022-07-24 ENCOUNTER — Ambulatory Visit: Payer: PPO

## 2022-07-24 ENCOUNTER — Ambulatory Visit: Payer: PPO | Admitting: Certified Registered Nurse Anesthetist

## 2022-07-24 ENCOUNTER — Other Ambulatory Visit: Payer: Self-pay

## 2022-07-24 ENCOUNTER — Ambulatory Visit
Admission: RE | Admit: 2022-07-24 | Discharge: 2022-07-24 | Disposition: A | Discharge status: Home / Self Care | Payer: PPO | Attending: Pulmonary Disease | Admitting: Pulmonary Disease

## 2022-07-24 DIAGNOSIS — C3411 Malignant neoplasm of upper lobe, right bronchus or lung: Secondary | ICD-10-CM | POA: Diagnosis not present

## 2022-07-24 DIAGNOSIS — N400 Enlarged prostate without lower urinary tract symptoms: Secondary | ICD-10-CM | POA: Diagnosis not present

## 2022-07-24 DIAGNOSIS — F17210 Nicotine dependence, cigarettes, uncomplicated: Secondary | ICD-10-CM | POA: Diagnosis not present

## 2022-07-24 DIAGNOSIS — J189 Pneumonia, unspecified organism: Secondary | ICD-10-CM | POA: Insufficient documentation

## 2022-07-24 DIAGNOSIS — R911 Solitary pulmonary nodule: Secondary | ICD-10-CM | POA: Diagnosis not present

## 2022-07-24 DIAGNOSIS — I10 Essential (primary) hypertension: Secondary | ICD-10-CM | POA: Diagnosis not present

## 2022-07-24 DIAGNOSIS — Z09 Encounter for follow-up examination after completed treatment for conditions other than malignant neoplasm: Secondary | ICD-10-CM | POA: Insufficient documentation

## 2022-07-24 DIAGNOSIS — E785 Hyperlipidemia, unspecified: Secondary | ICD-10-CM | POA: Diagnosis not present

## 2022-07-24 DIAGNOSIS — Z8616 Personal history of COVID-19: Secondary | ICD-10-CM | POA: Insufficient documentation

## 2022-07-24 DIAGNOSIS — M1990 Unspecified osteoarthritis, unspecified site: Secondary | ICD-10-CM | POA: Diagnosis not present

## 2022-07-24 DIAGNOSIS — B1910 Unspecified viral hepatitis B without hepatic coma: Secondary | ICD-10-CM | POA: Insufficient documentation

## 2022-07-24 DIAGNOSIS — D381 Neoplasm of uncertain behavior of trachea, bronchus and lung: Secondary | ICD-10-CM | POA: Diagnosis not present

## 2022-07-24 DIAGNOSIS — M5090 Cervical disc disorder, unspecified, unspecified cervical region: Secondary | ICD-10-CM | POA: Insufficient documentation

## 2022-07-24 DIAGNOSIS — J449 Chronic obstructive pulmonary disease, unspecified: Secondary | ICD-10-CM | POA: Insufficient documentation

## 2022-07-24 DIAGNOSIS — R918 Other nonspecific abnormal finding of lung field: Secondary | ICD-10-CM | POA: Diagnosis not present

## 2022-07-24 HISTORY — PX: VIDEO BRONCHOSCOPY WITH ENDOBRONCHIAL ULTRASOUND: SHX6177

## 2022-07-24 SURGERY — BRONCHOSCOPY, WITH EBUS
Anesthesia: General

## 2022-07-24 MED ORDER — PROPOFOL 10 MG/ML IV BOLUS
INTRAVENOUS | Status: DC | PRN
  Administered 2022-07-24: 150 mg via INTRAVENOUS
  Administered 2022-07-24: 50 mg via INTRAVENOUS

## 2022-07-24 MED ORDER — ORAL CARE MOUTH RINSE: 15.0000 mL | Freq: Once | OROMUCOSAL | Status: AC

## 2022-07-24 MED ORDER — PHENYLEPHRINE HCL-NACL 20-0.9 MG/250ML-% IV SOLN
INTRAVENOUS | Status: AC
  Filled 2022-07-24: qty 250

## 2022-07-24 MED ORDER — BUTAMBEN-TETRACAINE-BENZOCAINE 2-2-14 % EX AERO: 1.0000 | INHALATION_SPRAY | Freq: Once | CUTANEOUS | Status: DC

## 2022-07-24 MED ORDER — PROPOFOL 10 MG/ML IV BOLUS
INTRAVENOUS | Status: AC
  Filled 2022-07-24: qty 20

## 2022-07-24 MED ORDER — DEXMEDETOMIDINE HCL IN NACL 80 MCG/20ML IV SOLN
INTRAVENOUS | Status: DC | PRN
  Administered 2022-07-24: 8 ug via BUCCAL
  Administered 2022-07-24: 4 ug via BUCCAL

## 2022-07-24 MED ORDER — PHENYLEPHRINE 80 MCG/ML (10ML) SYRINGE FOR IV PUSH (FOR BLOOD PRESSURE SUPPORT)
PREFILLED_SYRINGE | INTRAVENOUS | Status: DC | PRN
  Administered 2022-07-24: 80 ug via INTRAVENOUS
  Administered 2022-07-24 (×2): 160 ug via INTRAVENOUS
  Administered 2022-07-24: 80 ug via INTRAVENOUS
  Administered 2022-07-24: 160 ug via INTRAVENOUS
  Administered 2022-07-24: 80 ug via INTRAVENOUS

## 2022-07-24 MED ORDER — OXYCODONE HCL 5 MG/5ML PO SOLN: 5.0000 mg | Freq: Once | ORAL | Status: DC | PRN

## 2022-07-24 MED ORDER — LIDOCAINE HCL (PF) 1 % IJ SOLN
30.0000 mL | Freq: Once | INTRAMUSCULAR | Status: DC
  Filled 2022-07-24: qty 30

## 2022-07-24 MED ORDER — ONDANSETRON HCL 4 MG/2ML IJ SOLN: 4.0000 mg | Freq: Once | INTRAMUSCULAR | Status: DC | PRN

## 2022-07-24 MED ORDER — ESMOLOL HCL 100 MG/10ML IV SOLN
INTRAVENOUS | Status: AC
  Filled 2022-07-24: qty 10

## 2022-07-24 MED ORDER — FENTANYL CITRATE (PF) 100 MCG/2ML IJ SOLN
INTRAMUSCULAR | Status: AC
  Filled 2022-07-24: qty 2

## 2022-07-24 MED ORDER — IPRATROPIUM-ALBUTEROL 0.5-2.5 (3) MG/3ML IN SOLN
RESPIRATORY_TRACT | Status: AC
  Filled 2022-07-24: qty 3

## 2022-07-24 MED ORDER — ROCURONIUM BROMIDE 100 MG/10ML IV SOLN
INTRAVENOUS | Status: DC | PRN
  Administered 2022-07-24: 50 mg via INTRAVENOUS
  Administered 2022-07-24: 30 mg via INTRAVENOUS

## 2022-07-24 MED ORDER — DEXAMETHASONE SODIUM PHOSPHATE 10 MG/ML IJ SOLN
INTRAMUSCULAR | Status: DC | PRN
  Administered 2022-07-24: 10 mg via INTRAVENOUS

## 2022-07-24 MED ORDER — IPRATROPIUM-ALBUTEROL 0.5-2.5 (3) MG/3ML IN SOLN
3.0000 mL | RESPIRATORY_TRACT | Status: AC
  Administered 2022-07-24: 3 mL via RESPIRATORY_TRACT

## 2022-07-24 MED ORDER — ROCURONIUM BROMIDE 10 MG/ML (PF) SYRINGE
PREFILLED_SYRINGE | INTRAVENOUS | Status: AC
  Filled 2022-07-24: qty 10

## 2022-07-24 MED ORDER — IPRATROPIUM-ALBUTEROL 0.5-2.5 (3) MG/3ML IN SOLN
3.0000 mL | Freq: Once | RESPIRATORY_TRACT | Status: AC
  Administered 2022-07-24: 3 mL via RESPIRATORY_TRACT

## 2022-07-24 MED ORDER — DEXMEDETOMIDINE HCL IN NACL 80 MCG/20ML IV SOLN
INTRAVENOUS | Status: AC
  Filled 2022-07-24: qty 20

## 2022-07-24 MED ORDER — LIDOCAINE HCL (PF) 2 % IJ SOLN
INTRAMUSCULAR | Status: AC
  Filled 2022-07-24: qty 5

## 2022-07-24 MED ORDER — ESMOLOL HCL 100 MG/10ML IV SOLN
INTRAVENOUS | Status: DC | PRN
  Administered 2022-07-24: 20 mg via INTRAVENOUS

## 2022-07-24 MED ORDER — PHENYLEPHRINE HCL 0.25 % NA SOLN: 1.0000 | Freq: Four times a day (QID) | NASAL | Status: DC | PRN

## 2022-07-24 MED ORDER — LACTATED RINGERS IV SOLN: INTRAVENOUS | Status: DC

## 2022-07-24 MED ORDER — ONDANSETRON HCL 4 MG/2ML IJ SOLN
INTRAMUSCULAR | Status: DC | PRN
  Administered 2022-07-24: 4 mg via INTRAVENOUS

## 2022-07-24 MED ORDER — CHLORHEXIDINE GLUCONATE 0.12 % MT SOLN
OROMUCOSAL | Status: AC
  Filled 2022-07-24: qty 15

## 2022-07-24 MED ORDER — PHENYLEPHRINE HCL-NACL 20-0.9 MG/250ML-% IV SOLN
INTRAVENOUS | Status: DC | PRN
  Administered 2022-07-24: 40 ug/min via INTRAVENOUS

## 2022-07-24 MED ORDER — FENTANYL CITRATE (PF) 100 MCG/2ML IJ SOLN: 25.0000 ug | INTRAMUSCULAR | Status: DC | PRN

## 2022-07-24 MED ORDER — OXYCODONE HCL 5 MG PO TABS: 5.0000 mg | ORAL_TABLET | Freq: Once | ORAL | Status: DC | PRN

## 2022-07-24 MED ORDER — ONDANSETRON HCL 4 MG/2ML IJ SOLN
INTRAMUSCULAR | Status: AC
  Filled 2022-07-24: qty 2

## 2022-07-24 MED ORDER — FENTANYL CITRATE (PF) 100 MCG/2ML IJ SOLN
INTRAMUSCULAR | Status: DC | PRN
  Administered 2022-07-24 (×2): 50 ug via INTRAVENOUS

## 2022-07-24 MED ORDER — LIDOCAINE HCL URETHRAL/MUCOSAL 2 % EX GEL
1.0000 | Freq: Once | CUTANEOUS | Status: DC
  Filled 2022-07-24: qty 5

## 2022-07-24 MED ORDER — LIDOCAINE HCL (CARDIAC) PF 100 MG/5ML IV SOSY
PREFILLED_SYRINGE | INTRAVENOUS | Status: DC | PRN
  Administered 2022-07-24: 100 mg via INTRAVENOUS

## 2022-07-24 MED ORDER — DEXAMETHASONE SODIUM PHOSPHATE 10 MG/ML IJ SOLN
INTRAMUSCULAR | Status: AC
  Filled 2022-07-24: qty 1

## 2022-07-24 MED ORDER — CHLORHEXIDINE GLUCONATE 0.12 % MT SOLN
15.0000 mL | Freq: Once | OROMUCOSAL | Status: AC
  Administered 2022-07-24: 15 mL via OROMUCOSAL

## 2022-07-24 MED ORDER — SUGAMMADEX SODIUM 200 MG/2ML IV SOLN
INTRAVENOUS | Status: DC | PRN
  Administered 2022-07-24: 200 mg via INTRAVENOUS

## 2022-07-24 MED ORDER — ACETAMINOPHEN 10 MG/ML IV SOLN: 1000.0000 mg | Freq: Once | INTRAVENOUS | Status: DC | PRN

## 2022-07-24 NOTE — Anesthesia Postprocedure Evaluation (Signed)
Anesthesia Post Note  Patient: Minerva Areola  Procedure(s) Performed: VIDEO BRONCHOSCOPY WITH ENDOBRONCHIAL ULTRASOUND ROBOTIC ASSISTED NAVIGATIONAL BRONCHOSCOPY  Patient location during evaluation: Phase II Anesthesia Type: General Level of consciousness: awake and alert, oriented and patient cooperative Pain management: pain level controlled Vital Signs Assessment: post-procedure vital signs reviewed and stable Respiratory status: spontaneous breathing, nonlabored ventilation and respiratory function stable Cardiovascular status: blood pressure returned to baseline and stable Postop Assessment: adequate PO intake Anesthetic complications: no   No notable events documented.   Last Vitals:  Vitals:   07/24/22 1100 07/24/22 1106  BP: 95/70 116/72  Pulse: 80 85  Resp: 12 15  Temp:    SpO2: 95% 94%    Last Pain:  Vitals:   07/24/22 1106  TempSrc: Temporal  PainSc: 0-No pain                 Reed Breech

## 2022-07-24 NOTE — Procedures (Signed)
ELECTROMAGNETIC NAVIGATIONAL BRONCHOSCOPY PROCEDURE NOTE  FIBEROPTIC BRONCHOSCOPY WITH BRONCHOALVEOLAR LAVAGE AND THERAPEUTIC ASPIRATION OF TRACHEOBRONCHIAL TREE PROCEDURE NOTE  ENDOBRONCHIAL ULTRASOUND PROCEDURE NOTE    Flexible bronchoscopy was performed  by : Karna Christmas MD  assistance by : 1)Repiratory therapist  and 2)[employer] staff and 3) Anesthesia team and 4) Flouroscopy team and 5) [facility] supporting staff   Indication for the procedure was :  Pre-procedural H&P. The following assessment was performed on the day of the procedure prior to initiating sedation History:  Chest pain n Dyspnea y Hemoptysis n Cough y Fever n Other pertinent items n  Examination Vital signs -reviewed as per nursing documentation today Cardiac    Murmurs: n  Rubs : n  Gallop: n Lungs Wheezing: n Rales : n Rhonchi :y  Other pertinent findings: SOB/hypoxemia due to chronic lung disease   Pre-procedural assessment for Procedural Sedation included: Depth of sedation: As per anesthesia team  ASA Classification:  2 Mallampati airway assessment: 3    Medication list reviewed: y  The patient's interval history was taken and revealed: no new complaints The pre- procedure physical examination revealed: No new findings Refer to prior clinic note for details.  Informed Consent: Informed consent was obtained from:  patient after explanation of procedure and risks, benefits, as well as alternative procedures available.  Explanation of level of sedation and possible transfusion was also provided.    Procedural Preparation: Time out was performed and patient was identified by name and birthdate and procedure to be performed and side for sampling, if any, was specified. Pt was intubated by anesthesia.  The patient was appropriately draped.   Fiberoptic bronchoscopy with airway inspection and BAL Procedure findings:  Bronchoscope was inserted via ETT  without difficulty.  Posterior  oropharynx, epiglottis, arytenoids, false cords and vocal cords were not visualized as these were bypassed by endotracheal tube. The distal trachea was normal in circumference and appearance without mucosal, cartilaginous or branching abnormalities.  The main carina was mildly splayed . All right and left lobar airways were visualized to the Subsegmental level.  Sub- sub segmental carinae were identified in all the distal airways.   Secretions were visible in the following airways and appeared to be clear.  The mucosa was : friable at RUL  Airways were notable for:        exophytic lesions :n       extrinsic compression in the following distributions: n.       Friable mucosa: y       Teacher, music /pigmentation: n   MULTIPLE SEGMENTS WITH INSPISSATED MUCUS PLUGGING REQUIRED THERAPEUTIC ASPIRATION INCLUDING RLL, LLL, RUL AND RML  Post procedure Diagnosis:   MUCUS PLUGGING OF BILATERAL AIRWAYS     Robotic Navigational Bronchoscopy Procedure Findings:    Post appropriate planning and registration peripheral navigation was used to visualize target lesion.    CYTOBRUSH X 2 - = ATYPICAL CELLS  ENDOBRONCHIAL BIOPSY OF RIGHT UPPER LOBE X 8 - LESIONAL CARCINOMA  Post procedure diagnosis: LUNG CANCER   STATION 10L - 6mm - not biopsied STATION 7 - 1.55mm - biopsied 3 times STATION  10R -7mm - not biopsied    Endobronchial ultrasound assisted hilar and mediastinal lymph node biopsies procedure findings: The fiberoptic bronchoscope was removed and the EBUS scope was introduced. Examination began to evaluate for pathologically enlarged lymph nodes starting on the LEFT side progressing to the RIGHT side.  All lymph node biopsies performed with 21G needle. Lymph node biopsies were sent in  cytolite for all stations.   Post procedure diagnosis:  lymphadenopathy suspicious for metastasis   Specimens obtained included:    Fluoroscopy Used: YES ;        Pictorial documentation attached:  NONE       Immediate sampling complications included:NONE  Epinephrine ZERO ml was used topically  The bronchoscopy was terminated due to completion of the planned procedure and the bronchoscope was removed.   Total dosage of Lidocaine was ZERO mg Total fluoroscopy time was as per radiology  minutes  Supplemental oxygen was provided at as per anesthesia lpm by nasal canula post operatively  Estimated Blood loss: <5cc expected .  Complications included:  None immediate   Preliminary CXR findings :  IN PROCESS   Disposition: HOME WITH WIFE   Follow up with Dr. Karna Christmas in 5 days for result discussion.     Vida Rigger MD  [facility] Division of Pulmonary & Critical Care Medicine

## 2022-07-24 NOTE — Anesthesia Procedure Notes (Addendum)
Procedure Name: Intubation Date/Time: 07/24/2022 8:28 AM  Performed by: Hezzie Bump, CRNAPre-anesthesia Checklist: Patient identified, Patient being monitored, Timeout performed, Emergency Drugs available and Suction available Patient Re-evaluated:Patient Re-evaluated prior to induction Oxygen Delivery Method: Circle system utilized Preoxygenation: Pre-oxygenation with 100% oxygen Induction Type: IV induction Ventilation: Mask ventilation without difficulty, Two handed mask ventilation required and Oral airway inserted - appropriate to patient size Laryngoscope Size: McGraph and 4 Grade View: Grade I Tube type: Oral Tube size: 9.0 mm Number of attempts: 1 Airway Equipment and Method: Stylet and Video-laryngoscopy Placement Confirmation: ETT inserted through vocal cords under direct vision, positive ETCO2 and breath sounds checked- equal and bilateral Secured at: 22 cm Tube secured with: Tape Dental Injury: Teeth and Oropharynx as per pre-operative assessment  Comments: 2 handed mask due to beard and no teeth

## 2022-07-24 NOTE — Anesthesia Preprocedure Evaluation (Addendum)
Anesthesia Evaluation  Patient identified by MRN, date of birth, ID band Patient awake    Reviewed: Allergy & Precautions, NPO status , Patient's Chart, lab work & pertinent test results  History of Anesthesia Complications Negative for: history of anesthetic complications  Airway Mallampati: II   Neck ROM: Full    Dental  (+) Edentulous Upper, Edentulous Lower   Pulmonary Current Smoker (1-2 cigars daily) and Patient abstained from smoking.    + wheezing      Cardiovascular hypertension, Normal cardiovascular exam Rhythm:Regular Rate:Normal  ECG 07/22/22:  Sinus rhythm with frequent Premature ventricular complexes Otherwise normal ECG When compared with ECG of 28-Jun-2022 11:02, No significant change since last tracing   Neuro/Psych Alcohol use disorder, 7 beers per day    GI/Hepatic ,GERD  ,,  Endo/Other  negative endocrine ROS    Renal/GU negative Renal ROS   BPH    Musculoskeletal  (+) Arthritis ,    Abdominal   Peds  Hematology negative hematology ROS (+)   Anesthesia Other Findings   Reproductive/Obstetrics                             Anesthesia Physical Anesthesia Plan  ASA: 2  Anesthesia Plan: General   Post-op Pain Management:    Induction: Intravenous  PONV Risk Score and Plan: 1 and Ondansetron, Dexamethasone and Treatment may vary due to age or medical condition  Airway Management Planned: Oral ETT  Additional Equipment:   Intra-op Plan:   Post-operative Plan: Extubation in OR  Informed Consent: I have reviewed the patients History and Physical, chart, labs and discussed the procedure including the risks, benefits and alternatives for the proposed anesthesia with the patient or authorized representative who has indicated his/her understanding and acceptance.     Dental advisory given  Plan Discussed with: CRNA  Anesthesia Plan Comments: (Patient  consented for risks of anesthesia including but not limited to:  - adverse reactions to medications - damage to eyes, teeth, lips or other oral mucosa - nerve damage due to positioning  - sore throat or hoarseness - damage to heart, brain, nerves, lungs, other parts of body or loss of life  Informed patient about role of CRNA in peri- and intra-operative care.  Patient voiced understanding.)        Anesthesia Quick Evaluation

## 2022-07-24 NOTE — Transfer of Care (Signed)
Immediate Anesthesia Transfer of Care Note  Patient: David Clark  Procedure(s) Performed: VIDEO BRONCHOSCOPY WITH ENDOBRONCHIAL ULTRASOUND ROBOTIC ASSISTED NAVIGATIONAL BRONCHOSCOPY  Patient Location: PACU  Anesthesia Type:General  Level of Consciousness: awake, alert , and oriented  Airway & Oxygen Therapy: Patient Spontanous Breathing  Post-op Assessment: Report given to RN and Post -op Vital signs reviewed and stable  Post vital signs: Reviewed and stable  Last Vitals:  Vitals Value Taken Time  BP 120/78 07/24/22 1021  Temp 35.8 C 07/24/22 1021  Pulse 85 07/24/22 1022  Resp 15 07/24/22 1022  SpO2 94 % 07/24/22 1022  Vitals shown include unvalidated device data.  Last Pain:  Vitals:   07/24/22 1021  TempSrc:   PainSc: Asleep      Patients Stated Pain Goal: 0 (07/24/22 0640)  Complications: No notable events documented.

## 2022-07-24 NOTE — Discharge Instructions (Addendum)
AMBULATORY SURGERY  ?DISCHARGE INSTRUCTIONS ? ? ?The drugs that you were given will stay in your system until tomorrow so for the next 24 hours you should not: ? ?Drive an automobile ?Make any legal decisions ?Drink any alcoholic beverage ? ? ?You may resume regular meals tomorrow.  Today it is better to start with liquids and gradually work up to solid foods. ? ?You may eat anything you prefer, but it is better to start with liquids, then soup and crackers, and gradually work up to solid foods. ? ? ?Please notify your doctor immediately if you have any unusual bleeding, trouble breathing, redness and pain at the surgery site, drainage, fever, or pain not relieved by medication. ? ? ? ?Additional Instructions: ? ? ? ?Please contact your physician with any problems or Same Day Surgery at [phone], Monday through Friday 6 am to 4 pm, or [place] [facility] [place] Main number at [phone].  ?

## 2022-07-24 NOTE — H&P (Signed)
PULMONOLOGY         Date: 07/24/2022,   MRN# [id number] David Clark 11-Aug-1955     AdmissionWeight: 87.5 kg                 CurrentWeight: 87.5 kg  Referring provider: Dr Meredeth Ide   CHIEF COMPLAINT:   Right lung spiculated lesion suspicious for bronchogenic carcinoma   HISTORY OF PRESENT ILLNESS   This is a 67 year old male with a history of arthritis, BPH cervical disc disease COPD, COVID-19, hepatitis B infection dyslipidemia, hypertension, pneumonia, sepsis previous squamous cell skin cancer of the left upper arm, who came in after being seen in clinic due to spiculated right lung lesion.  He is a current smoker and has COPD with at least moderate pretest probability of lung cancer.  Patient is here today for bronchoscopic airway inspection, as well as therapeutic aspiration of tracheobronchial tree for mucous plugging, endobronchial ultrasound assisted lymph node evaluation and biopsies and robotic bronchoscopy for endobronchial and transbronchial biopsies of spiculated lesion of the right lung.  We discussed the procedure in detail patient wishes to continue to proceed.  Risks and benefits as well as complications have been discussed with the patient and questions have been answered.   PAST MEDICAL HISTORY   Past Medical History:  Diagnosis Date   Arthritis    BPH (benign prostatic hyperplasia)    Cervical disc disease    COPD (chronic obstructive pulmonary disease)    COVID    GERD (gastroesophageal reflux disease)    Hepatitis B 1993   HLD (hyperlipidemia)    Hypertension    Hypomagnesemia    Hyponatremia    Pneumonia    Sepsis    Squamous cell carcinoma, arm, left      SURGICAL HISTORY   Past Surgical History:  Procedure Laterality Date   ANTERIOR CERVICAL DECOMP/DISCECTOMY FUSION N/A 01/01/2021   Procedure: C5-6 ANTERIOR CERVICAL DECOMPRESSION/DISCECTOMY FUSION 1 LEVEL;  Surgeon: Venetia Night, MD;  Location: [facility] ORS;  Service:  Neurosurgery;  Laterality: N/A;     FAMILY HISTORY   Family History  Problem Relation Age of Onset   Diabetes type II Mother    COPD Mother    Heart attack Mother    Heart attack Father    Dementia Father    Prostate cancer Father    Heart disease Sister      SOCIAL HISTORY   Social History   Tobacco Use   Smoking status: Some Days    Types: Cigarettes, Cigars    Last attempt to quit: 1992    Years since quitting: 32.3   Smokeless tobacco: Current    Types: Snuff  Vaping Use   Vaping Use: Never used  Substance Use Topics   Alcohol use: Yes    Alcohol/week: 42.0 standard drinks of alcohol    Types: 42 Cans of beer per week    Comment: 6 pk day   Drug use: Not Currently    Types: Cocaine    Comment: IV use 1980"s     MEDICATIONS    Home Medication:    Current Medication:  Current Facility-Administered Medications:    lactated ringers infusion, , Intravenous, Continuous, Lenard Simmer, MD, Last Rate: 10 mL/hr at 07/24/22 0715, New Bag at 07/24/22 0715    ALLERGIES   Patient has no known allergies.     REVIEW OF SYSTEMS    Review of Systems:  Gen:  Denies  fever, sweats, chills weigh loss  HEENT:  Denies blurred vision, double vision, ear pain, eye pain, hearing loss, nose bleeds, sore throat Cardiac:  No dizziness, chest pain or heaviness, chest tightness,edema Resp:   reports dyspnea chronically  Gi: Denies swallowing difficulty, stomach pain, nausea or vomiting, diarrhea, constipation, bowel incontinence Gu:  Denies bladder incontinence, burning urine Ext:   Denies Joint pain, stiffness or swelling Skin: Denies  skin rash, easy bruising or bleeding or hives Endoc:  Denies polyuria, polydipsia , polyphagia or weight change Psych:   Denies depression, insomnia or hallucinations   Other:  All other systems negative   VS: BP 116/82   Pulse 90   Temp 97.6 F (36.4 C) (Temporal)   Resp 16   Ht 6' (1.829 m)   Wt 87.5 kg   SpO2 97%   BMI  26.16 kg/m      PHYSICAL EXAM    GENERAL:NAD, no fevers, chills, no weakness no fatigue HEAD: Normocephalic, atraumatic.  EYES: Pupils equal, round, reactive to light. Extraocular muscles intact. No scleral icterus.  MOUTH: Moist mucosal membrane. Dentition intact. No abscess noted.  EAR, NOSE, THROAT: Clear without exudates. No external lesions.  NECK: Supple. No thyromegaly. No nodules. No JVD.  PULMONARY: decreased breath sounds with mild rhonchi worse at bases bilaterally.  CARDIOVASCULAR: S1 and S2. Regular rate and rhythm. No murmurs, rubs, or gallops. No edema. Pedal pulses 2+ bilaterally.  GASTROINTESTINAL: Soft, nontender, nondistended. No masses. Positive bowel sounds. No hepatosplenomegaly.  MUSCULOSKELETAL: No swelling, clubbing, or edema. Range of motion full in all extremities.  NEUROLOGIC: Cranial nerves II through XII are intact. No gross focal neurological deficits. Sensation intact. Reflexes intact.  SKIN: No ulceration, lesions, rashes, or cyanosis. Skin warm and dry. Turgor intact.  PSYCHIATRIC: Mood, affect within normal limits. The patient is awake, alert and oriented x 3. Insight, judgment intact.       IMAGING     ASSESSMENT/PLAN   Right upper lobe spiculated lesion over the lung This area is hypermetabolic on PET scan as suggestive of carcinoma -Patient is here today for airway inspection via bronchoscopy, therapeutic aspiration of tracheobronchial tree, endobronchial assisted lymph node evaluation biopsies, robotic bronchoscopy with endobronchial and transbronchial biopsies of spiculated hypermetabolic lesion of the right upper lobe -Reviewed risks/complications and benefits with patient, risks include infection, pneumothorax/pneumomediastinum which may require chest tube placement as well as overnight/prolonged hospitalization and possible mechanical ventilation. Other risks include bleeding and very rarely death.  Patient understands risks and wishes to  proceed.  Additional questions were answered, and patient is aware that post procedure patient will be going home with family and may experience cough with possible clots on expectoration as well as phlegm which may last few days as well as hoarseness of voice post intubation and mechanical ventilation.        Thank you for allowing me to participate in the care of this patient.   Patient/Family are satisfied with care plan and all questions have been answered.    Provider disclosure: Patient with at least one acute or chronic illness or injury that poses a threat to life or bodily function and is being managed actively during this encounter.  All of the below services have been performed independently by signing provider:  review of prior documentation from internal and or external health records.  Review of previous and current lab results.  Interview and comprehensive assessment during patient visit today. Review of current and previous chest radiographs/CT scans. Discussion of management and test interpretation with health care team and  patient/family.   This document was prepared using Dragon voice recognition software and may include unintentional dictation errors.     Ottie Glazier, M.D.  Division of Pulmonary & Critical Care Medicine

## 2022-07-27 ENCOUNTER — Encounter: Payer: Self-pay | Admitting: Pulmonary Disease

## 2022-07-28 ENCOUNTER — Other Ambulatory Visit: Payer: Self-pay | Admitting: Anatomic Pathology & Clinical Pathology

## 2022-07-28 LAB — CYTOLOGY - NON PAP

## 2022-07-28 LAB — SURGICAL PATHOLOGY

## 2022-07-30 ENCOUNTER — Inpatient Hospital Stay (HOSPITAL_BASED_OUTPATIENT_CLINIC_OR_DEPARTMENT_OTHER): Payer: PPO | Admitting: Oncology

## 2022-07-30 ENCOUNTER — Inpatient Hospital Stay: Payer: PPO | Admitting: Oncology

## 2022-07-30 ENCOUNTER — Encounter: Payer: Self-pay | Admitting: Oncology

## 2022-07-30 ENCOUNTER — Encounter: Payer: Self-pay | Admitting: *Deleted

## 2022-07-30 VITALS — BP 147/87 | HR 100 | Temp 95.0°F | Resp 17 | Wt 192.0 lb

## 2022-07-30 DIAGNOSIS — C3491 Malignant neoplasm of unspecified part of right bronchus or lung: Secondary | ICD-10-CM | POA: Diagnosis not present

## 2022-07-30 DIAGNOSIS — C3411 Malignant neoplasm of upper lobe, right bronchus or lung: Secondary | ICD-10-CM | POA: Diagnosis not present

## 2022-07-30 NOTE — Progress Notes (Signed)
[facility]  [phone] [phone]  ID: David Clark OB: Feb 20, 1956  MR#: [id number]  AOZ#:[id number]  Patient Care Team: Danella Penton, MD as PCP - General (Internal Medicine) Glory Buff, RN as Oncology Nurse Navigator  CHIEF COMPLAINT: Stage IA2 adenocarcinoma of the right upper lobe lung.    INTERVAL HISTORY: Patient returns to clinic today to discuss his biopsy results and treatment planning.  He currently feels well and is asymptomatic.  He has no neurologic complaints.  He denies any recent fevers.  He has a good appetite and denies weight loss.  He denies any chest pain, shortness of breath, cough, or hemoptysis.  He denies any nausea, vomiting, constipation, or diarrhea.  He has no urinary complaints.  Patient offers no specific complaints today.  REVIEW OF SYSTEMS:   Review of Systems  Constitutional: Negative.  Negative for fever, malaise/fatigue and weight loss.  Respiratory: Negative.  Negative for cough, hemoptysis and shortness of breath.   Cardiovascular: Negative.  Negative for chest pain and leg swelling.  Gastrointestinal: Negative.  Negative for abdominal pain.  Genitourinary: Negative.  Negative for frequency.  Musculoskeletal: Negative.  Negative for back pain.  Skin: Negative.  Negative for rash.  Neurological: Negative.  Negative for dizziness, focal weakness, weakness and headaches.  Psychiatric/Behavioral: Negative.  The patient is not nervous/anxious.     As per HPI. Otherwise, a complete review of systems is negative.  PAST MEDICAL HISTORY: Past Medical History:  Diagnosis Date   Arthritis    BPH (benign prostatic hyperplasia)    Cervical disc disease    COPD (chronic obstructive pulmonary disease)    COVID    GERD (gastroesophageal reflux disease)    Hepatitis B 1993   HLD (hyperlipidemia)    Hypertension    Hypomagnesemia    Hyponatremia    Pneumonia    Sepsis    Squamous cell carcinoma, arm, left      PAST SURGICAL HISTORY: Past Surgical History:  Procedure Laterality Date   ANTERIOR CERVICAL DECOMP/DISCECTOMY FUSION N/A 01/01/2021   Procedure: C5-6 ANTERIOR CERVICAL DECOMPRESSION/DISCECTOMY FUSION 1 LEVEL;  Surgeon: Venetia Night, MD;  Location: [facility] ORS;  Service: Neurosurgery;  Laterality: N/A;   VIDEO BRONCHOSCOPY WITH ENDOBRONCHIAL ULTRASOUND N/A 07/24/2022   Procedure: VIDEO BRONCHOSCOPY WITH ENDOBRONCHIAL ULTRASOUND;  Surgeon: Vida Rigger, MD;  Location: [facility] ORS;  Service: Thoracic;  Laterality: N/A;    FAMILY HISTORY: Family History  Problem Relation Age of Onset   Diabetes type II Mother    COPD Mother    Heart attack Mother    Heart attack Father    Dementia Father    Prostate cancer Father    Heart disease Sister     ADVANCED DIRECTIVES (Y/N):  N  HEALTH MAINTENANCE: Social History   Tobacco Use   Smoking status: Some Days    Types: Cigarettes, Cigars    Last attempt to quit: 1992    Years since quitting: 32.3   Smokeless tobacco: Current    Types: Snuff  Vaping Use   Vaping Use: Never used  Substance Use Topics   Alcohol use: Yes    Alcohol/week: 42.0 standard drinks of alcohol    Types: 42 Cans of beer per week    Comment: 6 pk day   Drug use: Not Currently    Types: Cocaine    Comment: IV use 1980"s     Colonoscopy:  PAP:  Bone density:  Lipid panel:  No Known Allergies  Current Outpatient Medications  Medication Sig Dispense Refill   albuterol (VENTOLIN HFA) 108 (90 Base) MCG/ACT inhaler Inhale 2 puffs into the lungs every 6 (six) hours as needed for wheezing or shortness of breath. 18 g 0   amLODipine (NORVASC) 5 MG tablet Take 5 mg by mouth at bedtime.     colchicine 0.6 MG tablet Take 0.6 mg by mouth 2 (two) times daily as needed (gout flare).     Fluticasone-Umeclidin-Vilant (TRELEGY ELLIPTA) 100-62.5-25 MCG/ACT AEPB Inhale 1 puff into the lungs daily.     folic acid (FOLVITE) 1 MG tablet Take 1 tablet (1 mg total) by  mouth daily. 30 tablet 0   ibuprofen (ADVIL) 200 MG tablet Take 200 mg by mouth every 6 (six) hours as needed.     omeprazole (PRILOSEC) 20 MG capsule Take 20 mg by mouth daily.     tamsulosin (FLOMAX) 0.4 MG CAPS capsule Take 0.4 mg by mouth daily.     thiamine (VITAMIN B-1) 100 MG tablet Take 1 tablet (100 mg total) by mouth daily. (Patient not taking: Reported on 07/20/2022) 30 tablet 0   No current facility-administered medications for this visit.    OBJECTIVE: Vitals:   07/30/22 1431  BP: (!) 147/87  Pulse: 100  Resp: 17  Temp: (!) 95 F (35 C)  SpO2: 98%     Body mass index is 26.04 kg/m.    ECOG FS:0 - Asymptomatic  General: Well-developed, well-nourished, no acute distress. Eyes: Pink conjunctiva, anicteric sclera. HEENT: Normocephalic, moist mucous membranes. Lungs: No audible wheezing or coughing. Heart: Regular rate and rhythm. Abdomen: Soft, nontender, no obvious distention. Musculoskeletal: No edema, cyanosis, or clubbing. Neuro: Alert, answering all questions appropriately. Cranial nerves grossly intact. Skin: No rashes or petechiae noted. Psych: Normal affect.  LAB RESULTS:  Lab Results  Component Value Date   NA 140 06/30/2022   K 3.6 06/30/2022   CL 109 06/30/2022   CO2 24 06/30/2022   GLUCOSE 118 (H) 06/30/2022   BUN 18 06/30/2022   CREATININE 0.79 06/30/2022   CALCIUM 8.6 (L) 06/30/2022   PROT 7.8 06/28/2022   ALBUMIN 4.4 06/28/2022   AST 47 (H) 06/28/2022   ALT 34 06/28/2022   ALKPHOS 73 06/28/2022   BILITOT 2.3 (H) 06/28/2022   GFRNONAA >60 06/30/2022    Lab Results  Component Value Date   WBC 6.0 07/22/2022   NEUTROABS 8.9 (H) 06/30/2022   HGB 16.9 07/22/2022   HCT 45.6 07/22/2022   MCV 96.8 07/22/2022   PLT 201 07/22/2022     STUDIES: CT SUPER D CHEST WO MONARCH PILOT  Result Date: 07/24/2022 CLINICAL DATA:  PET positive nodule within the right upper lobe. Pre bronchoscopy planning. * Tracking Code: BO * EXAM: CT CHEST WITHOUT  CONTRAST TECHNIQUE: Multidetector CT imaging of the chest was performed using thin slice collimation for electromagnetic bronchoscopy planning purposes, without intravenous contrast. RADIATION DOSE REDUCTION: This exam was performed according to the departmental dose-optimization program which includes automated exposure control, adjustment of the mA and/or kV according to patient size and/or use of iterative reconstruction technique. COMPARISON:  PET-CT 07/08/2022 FINDINGS: Cardiovascular: Heart size is normal. No pericardial effusion. Aortic atherosclerosis and coronary artery calcifications. Mediastinum/Nodes: No enlarged mediastinal, hilar, or axillary lymph nodes. Thyroid gland, trachea, and esophagus demonstrate no significant findings. Lungs/Pleura: No pleural effusion, airspace consolidation, or pneumothorax. Emphysema. The FDG avid non solid nodule within the right upper lobe is not significantly changed in size measuring 2.9 cm x 2.7 cm, image 65/4. Stable solid 5  mm left upper lobe lung nodule, image 54/4. Improving postinflammatory/infectious change within the right lower lobe. Several residual non-solid and part solid nodules within the posterior and lateral right lower lobe noted the largest of these is in the posterolateral aspect of the superior segment of right lower lobe measuring 1.4 cm, image 87/4. Upper Abdomen: No acute abnormality. Musculoskeletal: No chest wall mass or suspicious bone lesions identified. IMPRESSION: 1. Stable tracer avid non-solid nodule within the right upper lobe which is suspicious for underlying indolent pulmonary neoplasm such as adenocarcinoma. 2. Improving postinflammatory/infectious change within the right lower lobe with several persistent part solid and non solid right lower lobe lung nodules. These are favored to represent sequelae of recent infection. Attention in these nodules on future surveillance imaging is advised to ensure resolution and/or stability. 3.  Aortic Atherosclerosis (ICD10-I70.0) and Emphysema (ICD10-J43.9). Electronically Signed   By: Signa Kell M.D.   On: 07/24/2022 16:46   DG Chest Port 1 View  Result Date: 07/24/2022 CLINICAL DATA:  Status post bronchoscopy. EXAM: PORTABLE CHEST 1 VIEW COMPARISON:  CT chest 07/21/2022.  Two-view chest x-ray 06/28/2022 FINDINGS: The heart size is normal. Slight increase in a diffuse interstitial pattern may represent some edema on the right. No consolidated airspace disease is present. No pneumothorax is present. The visualized soft tissues and bony thorax are unremarkable. IMPRESSION: 1. Slight increase in a diffuse interstitial pattern may represent some edema on the right following bronchoscopy. 2. No pneumothorax. Electronically Signed   By: Marin Roberts M.D.   On: 07/24/2022 11:07   DG C-Arm 1-60 Min-No Report  Result Date: 07/24/2022 Fluoroscopy was utilized by the requesting physician.  No radiographic interpretation.   NM PET Image Initial (PI) Skull Base To Thigh  Result Date: 07/09/2022 CLINICAL DATA:  Initial treatment strategy for enlarging right upper lobe lung lesion. EXAM: NUCLEAR MEDICINE PET SKULL BASE TO THIGH TECHNIQUE: 10.29 mCi F-18 FDG was injected intravenously. Full-ring PET imaging was performed from the skull base to thigh after the radiotracer. CT data was obtained and used for attenuation correction and anatomic localization. Fasting blood glucose: 109 mg/dl COMPARISON:  Prior chest CT scans from 03/28/2022 and 06/28/2022 FINDINGS: Mediastinal blood pool activity: SUV max 1.75 Liver activity: SUV max NA NECK: No hypermetabolic lymph nodes in the neck. Incidental CT findings: Bilateral carotid artery calcifications. CHEST: The enlarging subsolid right upper lobe pulmonary lesion measuring a maximum of 2.8 cm demonstrates hypermetabolism. This is most notably in the more solid component medially. SUV max is 3.77. Findings suspicious for adenocarcinoma. Small focus of  hypermetabolism (SUV max 2.81) in the right lower lobe peripherally. This corresponds with a subpleural area of probable post pneumonic inflammatory change. I do not see a discrete lesion. 8 mm subpleural nodule in the superior segment of the right lower lobe on image number 59/4 has an SUV max of 1.38 and is indeterminate. 7 mm right upper lobe medially on image number 47/4 has an SUV max of 2.41 and is somewhat suspicious. Recommend continued close observation. No enlarged or hypermetabolic mediastinal or hilar lymph nodes. No supraclavicular or axillary adenopathy. Incidental CT findings: Most of the inflammatory/infectious changes seen on the most recent chest CT have resolved. Stable significant underlying emphysematous changes and pulmonary scarring. Stable 3 mm left upper lobe nodule on image number 51/4. Stable aortic and coronary artery calcifications. ABDOMEN/PELVIS: No abnormal hypermetabolic activity within the liver, pancreas, adrenal glands, or spleen. No hypermetabolic lymph nodes in the abdomen or pelvis. Incidental CT findings:  Mild splenomegaly but no splenic hypermetabolism or splenic lesions. No obvious changes of cirrhosis. Age advanced atherosclerotic calcifications involving the aorta and iliac arteries. SKELETON: No focal hypermetabolic activity to suggest skeletal metastasis. Incidental CT findings: None. IMPRESSION: 1. The enlarging subsolid right upper lobe pulmonary lesion is hypermetabolic and suspicious for adenocarcinoma. 2. Two small right lung nodules are indeterminate. Recommend continued close observation. 3. No enlarged or hypermetabolic mediastinal or hilar lymph nodes. 4. No findings for metastatic disease involving the abdomen/pelvis or bony structures. Aortic Atherosclerosis (ICD10-I70.0). Electronically Signed   By: Rudie Meyer M.D.   On: 07/09/2022 11:39    ASSESSMENT: Stage IA2 adenocarcinoma of the right upper lobe lung.  PLAN:    Stage IA2 adenocarcinoma of the  right upper lobe lung: Diagnosis confirmed by bronchoscopy on July 24, 2022.  PET scan results from July 08, 2022 reviewed independently with no obvious evidence of disease outside his known pulmonary nodule.  Patient had PFTs done at an outside clinic.  Will refer to thoracic surgery in [facility] for further evaluation and consideration of surgical removal of the lesion.  If patient declines surgery, can consider XRT.  Return to clinic 1 to 2 weeks postoperatively to discuss his final pathology results and further diagnostic planning.     I spent a total of 30 minutes reviewing chart data, face-to-face evaluation with the patient, counseling and coordination of care as detailed above.  Patient expressed understanding and was in agreement with this plan. He also understands that He can call clinic at any time with any questions, concerns, or complaints.    Cancer Staging  Adenocarcinoma, lung, right Staging form: Lung, AJCC 8th Edition - Clinical stage from 07/30/2022: Stage IA2 (cT1b, cN0, cM0) - Signed by Jeralyn Ruths, MD on 07/30/2022 Stage prefix: Initial diagnosis   Jeralyn Ruths, MD   07/30/2022 2:56 PM

## 2022-07-30 NOTE — Progress Notes (Signed)
Met with patient during follow up visit with Dr. Orlie Dakin to discuss recent biopsy results and treatment options. All questions answered during visit. Informed pt that will place referral to thoracic surgery and to expect call from their office with an appt. Nothing further needed at this time. Instructed pt to call with any questions or needs. Pt verbalized understanding.

## 2022-07-30 NOTE — Progress Notes (Signed)
Patient here for oncology follow-up appointment,  no new concerns at this time.    

## 2022-08-06 ENCOUNTER — Other Ambulatory Visit: Payer: PPO

## 2022-08-13 NOTE — Progress Notes (Signed)
[address]       [place] [address]             [phone]                    David Clark [facility] Medical Record #[id number] Date of Birth: Jan 27, 1956  Referring: Jeralyn Ruths, MD Primary Care: Danella Penton, MD Primary Cardiologist: None  Chief Complaint:    Chief Complaint  Patient presents with   Lung Cancer    New patient consultation, Bronch 4/19, PET 4/16, Chest CT 4/16, PFTs 4/11    History of Present Illness:    David Clark 67 y.o. male presents for surgical evaluation of a biopsy-proven non-small cell lung cancer of the right upper lobe.  He has a history of tobacco abuse, continues to smoke.  He states that he is willing to quit today.  He has had a productive cough since he has been trying to cut down.  His weight has been stable.  He denies any neurologic symptoms.       Zubrod Score: At the time of surgery this patient's most appropriate activity status/level should be described as: [x]     0    Normal activity, no symptoms []     1    Restricted in physical strenuous activity but ambulatory, able to do out light work []     2    Ambulatory and capable of self care, unable to do work activities, up and about               >50 % of waking hours                              []     3    Only limited self care, in bed greater than 50% of waking hours []     4    Completely disabled, no self care, confined to bed or chair []     5    Moribund   Past Medical History:  Diagnosis Date   Arthritis    BPH (benign prostatic hyperplasia)    Cervical disc disease    COPD (chronic obstructive pulmonary disease) (HCC)    COVID    GERD (gastroesophageal reflux disease)    Hepatitis B 1993   HLD (hyperlipidemia)    Hypertension    Hypomagnesemia    Hyponatremia    Pneumonia    Sepsis (HCC)    Squamous cell carcinoma, arm, left     Past Surgical History:  Procedure Laterality Date   ANTERIOR CERVICAL DECOMP/DISCECTOMY FUSION  N/A 01/01/2021   Procedure: C5-6 ANTERIOR CERVICAL DECOMPRESSION/DISCECTOMY FUSION 1 LEVEL;  Surgeon: Venetia Night, MD;  Location: [facility] ORS;  Service: Neurosurgery;  Laterality: N/A;   VIDEO BRONCHOSCOPY WITH ENDOBRONCHIAL ULTRASOUND N/A 07/24/2022   Procedure: VIDEO BRONCHOSCOPY WITH ENDOBRONCHIAL ULTRASOUND;  Surgeon: Vida Rigger, MD;  Location: [facility] ORS;  Service: Thoracic;  Laterality: N/A;    Family History  Problem Relation Age of Onset   Diabetes type II Mother    COPD Mother    Heart attack Mother    Heart attack Father    Dementia Father    Prostate cancer Father    Heart disease Sister      Social History   Tobacco Use  Smoking Status Some Days   Types: Cigarettes, Cigars   Last attempt to quit: 1992  Years since quitting: 32.3  Smokeless Tobacco Current   Types: Snuff    Social History   Substance and Sexual Activity  Alcohol Use Yes   Alcohol/week: 42.0 standard drinks of alcohol   Types: 42 Cans of beer per week   Comment: 6 pk day     No Known Allergies  Current Outpatient Medications  Medication Sig Dispense Refill   albuterol (VENTOLIN HFA) 108 (90 Base) MCG/ACT inhaler Inhale 2 puffs into the lungs every 6 (six) hours as needed for wheezing or shortness of breath. 18 g 0   amLODipine (NORVASC) 5 MG tablet Take 5 mg by mouth at bedtime.     colchicine 0.6 MG tablet Take 0.6 mg by mouth 2 (two) times daily as needed (gout flare).     Fluticasone-Umeclidin-Vilant (TRELEGY ELLIPTA) 100-62.5-25 MCG/ACT AEPB Inhale 1 puff into the lungs daily.     ibuprofen (ADVIL) 200 MG tablet Take 200 mg by mouth every 6 (six) hours as needed.     omeprazole (PRILOSEC) 20 MG capsule Take 20 mg by mouth daily.     tamsulosin (FLOMAX) 0.4 MG CAPS capsule Take 0.4 mg by mouth daily.     No current facility-administered medications for this visit.    Review of Systems  Constitutional:  Negative for malaise/fatigue and weight loss.  Respiratory:  Positive for  cough and shortness of breath.   Cardiovascular:  Negative for chest pain.  Neurological: Negative.   Psychiatric/Behavioral:  The patient is nervous/anxious.      PHYSICAL EXAMINATION: BP 138/87 (BP Location: Left Arm, Patient Position: Sitting, Cuff Size: Normal)   Pulse 96   Resp 20   Ht 6' (1.829 m)   Wt 186 lb (84.4 kg)   SpO2 96% Comment: RA  BMI 25.23 kg/m  Physical Exam Constitutional:      General: He is not in acute distress.    Appearance: Normal appearance. He is normal weight. He is not ill-appearing.  HENT:     Head: Normocephalic and atraumatic.  Eyes:     Extraocular Movements: Extraocular movements intact.  Cardiovascular:     Rate and Rhythm: Normal rate.  Pulmonary:     Effort: Pulmonary effort is normal. No respiratory distress.  Abdominal:     General: Abdomen is flat. There is no distension.  Musculoskeletal:        General: Normal range of motion.     Cervical back: Normal range of motion.  Skin:    General: Skin is warm and dry.  Neurological:     General: No focal deficit present.     Mental Status: He is alert and oriented to person, place, and time.     Diagnostic Studies & Laboratory data:     Recent Radiology Findings:   CT SUPER D CHEST WO MONARCH PILOT  Result Date: 07/24/2022 CLINICAL DATA:  PET positive nodule within the right upper lobe. Pre bronchoscopy planning. * Tracking Code: BO * EXAM: CT CHEST WITHOUT CONTRAST TECHNIQUE: Multidetector CT imaging of the chest was performed using thin slice collimation for electromagnetic bronchoscopy planning purposes, without intravenous contrast. RADIATION DOSE REDUCTION: This exam was performed according to the departmental dose-optimization program which includes automated exposure control, adjustment of the mA and/or kV according to patient size and/or use of iterative reconstruction technique. COMPARISON:  PET-CT 07/08/2022 FINDINGS: Cardiovascular: Heart size is normal. No pericardial  effusion. Aortic atherosclerosis and coronary artery calcifications. Mediastinum/Nodes: No enlarged mediastinal, hilar, or axillary lymph nodes. Thyroid gland, trachea, and esophagus  demonstrate no significant findings. Lungs/Pleura: No pleural effusion, airspace consolidation, or pneumothorax. Emphysema. The FDG avid non solid nodule within the right upper lobe is not significantly changed in size measuring 2.9 cm x 2.7 cm, image 65/4. Stable solid 5 mm left upper lobe lung nodule, image 54/4. Improving postinflammatory/infectious change within the right lower lobe. Several residual non-solid and part solid nodules within the posterior and lateral right lower lobe noted the largest of these is in the posterolateral aspect of the superior segment of right lower lobe measuring 1.4 cm, image 87/4. Upper Abdomen: No acute abnormality. Musculoskeletal: No chest wall mass or suspicious bone lesions identified. IMPRESSION: 1. Stable tracer avid non-solid nodule within the right upper lobe which is suspicious for underlying indolent pulmonary neoplasm such as adenocarcinoma. 2. Improving postinflammatory/infectious change within the right lower lobe with several persistent part solid and non solid right lower lobe lung nodules. These are favored to represent sequelae of recent infection. Attention in these nodules on future surveillance imaging is advised to ensure resolution and/or stability. 3. Aortic Atherosclerosis (ICD10-I70.0) and Emphysema (ICD10-J43.9). Electronically Signed   By: Signa Kell M.D.   On: 07/24/2022 16:46   DG Chest Port 1 View  Result Date: 07/24/2022 CLINICAL DATA:  Status post bronchoscopy. EXAM: PORTABLE CHEST 1 VIEW COMPARISON:  CT chest 07/21/2022.  Two-view chest x-ray 06/28/2022 FINDINGS: The heart size is normal. Slight increase in a diffuse interstitial pattern may represent some edema on the right. No consolidated airspace disease is present. No pneumothorax is present. The  visualized soft tissues and bony thorax are unremarkable. IMPRESSION: 1. Slight increase in a diffuse interstitial pattern may represent some edema on the right following bronchoscopy. 2. No pneumothorax. Electronically Signed   By: Marin Roberts M.D.   On: 07/24/2022 11:07   DG C-Arm 1-60 Min-No Report  Result Date: 07/24/2022 Fluoroscopy was utilized by the requesting physician.  No radiographic interpretation.       I have independently reviewed the above radiology studies  and reviewed the findings with the patient.   Recent Lab Findings: Lab Results  Component Value Date   WBC 6.0 07/22/2022   HGB 16.9 07/22/2022   HCT 45.6 07/22/2022   PLT 201 07/22/2022   GLUCOSE 118 (H) 06/30/2022   ALT 34 06/28/2022   AST 47 (H) 06/28/2022   NA 140 06/30/2022   K 3.6 06/30/2022   CL 109 06/30/2022   CREATININE 0.79 06/30/2022   BUN 18 06/30/2022   CO2 24 06/30/2022   INR 1.0 07/22/2022     PFTs:  Pending  CHEST: The enlarging subsolid right upper lobe pulmonary lesion measuring a maximum of 2.8 cm demonstrates hypermetabolism. This is most notably in the more solid component medially. SUV max is 3.77. Findings suspicious for adenocarcinoma.   Small focus of hypermetabolism (SUV max 2.81) in the right lower lobe peripherally. This corresponds with a subpleural area of probable post pneumonic inflammatory change. I do not see a discrete lesion.   8 mm subpleural nodule in the superior segment of the right lower lobe on image number 59/4 has an SUV max of 1.38 and is indeterminate.   7 mm right upper lobe medially on image number 47/4 has an SUV max of 2.41 and is somewhat suspicious. Recommend continued close observation.   No enlarged or hypermetabolic mediastinal or hilar lymph nodes. No supraclavicular or axillary adenopathy.         Assessment / Plan:   67 year old male with biopsy-proven non-small cell  lung cancer of the right upper lobe.  It measures  2.8 cm.  He also has a 7 mm nodule in his right lower lobe concerning for metastasis.  It is in superior segment.  Of note he has had 2 bouts of pneumonia in this area corresponds with his previous interstitial process.  We discussed potential surgical options.  I have recommended that he undergo a right upper lobectomy and a right lower lobe wedge resection.  He states that he will stop smoking today.  He will need pulmonary function testing as well as a stress test prior to surgery.   I  spent 40 minutes with  the patient face to face in counseling and coordination of care.    Corliss Skains 08/14/2022 12:57 PM

## 2022-08-14 ENCOUNTER — Encounter: Payer: Self-pay | Admitting: Thoracic Surgery (Cardiothoracic Vascular Surgery)

## 2022-08-14 ENCOUNTER — Other Ambulatory Visit: Payer: Self-pay | Admitting: Thoracic Surgery (Cardiothoracic Vascular Surgery)

## 2022-08-14 ENCOUNTER — Institutional Professional Consult (permissible substitution): Payer: PPO | Admitting: Thoracic Surgery (Cardiothoracic Vascular Surgery)

## 2022-08-14 VITALS — BP 138/87 | HR 96 | Resp 20 | Ht 72.0 in | Wt 186.0 lb

## 2022-08-14 DIAGNOSIS — C3491 Malignant neoplasm of unspecified part of right bronchus or lung: Secondary | ICD-10-CM | POA: Diagnosis not present

## 2022-08-17 ENCOUNTER — Telehealth (HOSPITAL_COMMUNITY): Payer: Self-pay | Admitting: *Deleted

## 2022-08-17 ENCOUNTER — Ambulatory Visit (HOSPITAL_COMMUNITY)
Admission: RE | Admit: 2022-08-17 | Discharge: 2022-08-17 | Disposition: A | Discharge status: Home / Self Care | Payer: PPO | Source: Ambulatory Visit | Attending: Thoracic Surgery (Cardiothoracic Vascular Surgery) | Admitting: Thoracic Surgery (Cardiothoracic Vascular Surgery)

## 2022-08-17 DIAGNOSIS — R942 Abnormal results of pulmonary function studies: Secondary | ICD-10-CM | POA: Diagnosis not present

## 2022-08-17 DIAGNOSIS — C3491 Malignant neoplasm of unspecified part of right bronchus or lung: Secondary | ICD-10-CM

## 2022-08-17 DIAGNOSIS — R059 Cough, unspecified: Secondary | ICD-10-CM | POA: Insufficient documentation

## 2022-08-17 LAB — PULMONARY FUNCTION TEST
DL/VA % pred: 53 %
DL/VA: 2.16 ml/min/mmHg/L
DLCO cor % pred: 54 %
DLCO cor: 15.12 ml/min/mmHg
DLCO unc % pred: 57 %
DLCO unc: 16.02 ml/min/mmHg
FEF 25-75 Post: 2.19 L/sec
FEF 25-75 Pre: 1.83 L/sec
FEF2575-%Change-Post: 19 %
FEF2575-%Pred-Post: 80 %
FEF2575-%Pred-Pre: 67 %
FEV1-%Change-Post: 5 %
FEV1-%Pred-Post: 94 %
FEV1-%Pred-Pre: 89 %
FEV1-Post: 3.35 L
FEV1-Pre: 3.18 L
FEV1FVC-%Change-Post: 4 %
FEV1FVC-%Pred-Pre: 90 %
FEV6-%Change-Post: 2 %
FEV6-%Pred-Post: 103 %
FEV6-%Pred-Pre: 101 %
FEV6-Post: 4.73 L
FEV6-Pre: 4.63 L
FEV6FVC-%Change-Post: 0 %
FEV6FVC-%Pred-Post: 103 %
FEV6FVC-%Pred-Pre: 103 %
FVC-%Change-Post: 1 %
FVC-%Pred-Post: 100 %
FVC-%Pred-Pre: 98 %
FVC-Post: 4.81 L
FVC-Pre: 4.74 L
Post FEV1/FVC ratio: 70 %
Post FEV6/FVC ratio: 98 %
Pre FEV1/FVC ratio: 67 %
Pre FEV6/FVC Ratio: 98 %
RV % pred: 99 %
RV: 2.52 L
TLC % pred: 99 %
TLC: 7.4 L

## 2022-08-17 MED ORDER — ALBUTEROL SULFATE (2.5 MG/3ML) 0.083% IN NEBU
2.5000 mg | INHALATION_SOLUTION | Freq: Once | RESPIRATORY_TRACT | Status: AC
  Administered 2022-08-17: 2.5 mg via RESPIRATORY_TRACT

## 2022-08-17 NOTE — Telephone Encounter (Signed)
Patient given detailed instructions per Myocardial Perfusion Study Information Sheet for the test on 08/19/22 Patient notified to arrive 15 minutes early and that it is imperative to arrive on time for appointment to keep from having the test rescheduled.  If you need to cancel or reschedule your appointment, please call the office within 24 hours of your appointment. . Patient verbalized understanding. Ahrianna Siglin Jacqueline   

## 2022-08-18 ENCOUNTER — Other Ambulatory Visit: Payer: Self-pay | Admitting: Thoracic Surgery (Cardiothoracic Vascular Surgery)

## 2022-08-18 DIAGNOSIS — C3491 Malignant neoplasm of unspecified part of right bronchus or lung: Secondary | ICD-10-CM

## 2022-08-19 ENCOUNTER — Ambulatory Visit (HOSPITAL_COMMUNITY): Payer: PPO | Attending: Internal Medicine

## 2022-08-19 DIAGNOSIS — C3491 Malignant neoplasm of unspecified part of right bronchus or lung: Secondary | ICD-10-CM

## 2022-08-19 DIAGNOSIS — Z01810 Encounter for preprocedural cardiovascular examination: Secondary | ICD-10-CM

## 2022-08-19 LAB — MYOCARDIAL PERFUSION IMAGING
Angina Index: 0
Duke Treadmill Score: 4
Estimated workload: 4.6
Exercise duration (min): 4 min
Exercise duration (sec): 12 s
LV dias vol: 80 mL (ref 62–150)
LV sys vol: 41 mL
MPHR: 151 {beats}/min
Nuc Stress EF: 49 %
Peak HR: 137 {beats}/min
Percent HR: 90 %
Rest HR: 86 {beats}/min
Rest Nuclear Isotope Dose: 10.8 mCi
SDS: 1
SRS: 0
SSS: 1
ST Depression (mm): 0 mm
Stress Nuclear Isotope Dose: 32.3 mCi
TID: 1.08

## 2022-08-19 MED ORDER — TECHNETIUM TC 99M TETROFOSMIN IV KIT
10.8000 | PACK | Freq: Once | INTRAVENOUS | Status: AC | PRN
  Administered 2022-08-19: 10.8 via INTRAVENOUS

## 2022-08-19 MED ORDER — TECHNETIUM TC 99M TETROFOSMIN IV KIT
32.3000 | PACK | Freq: Once | INTRAVENOUS | Status: AC | PRN
  Administered 2022-08-19: 32.3 via INTRAVENOUS

## 2022-08-21 ENCOUNTER — Ambulatory Visit (INDEPENDENT_AMBULATORY_CARE_PROVIDER_SITE_OTHER): Payer: PPO | Admitting: Thoracic Surgery (Cardiothoracic Vascular Surgery)

## 2022-08-21 DIAGNOSIS — C3491 Malignant neoplasm of unspecified part of right bronchus or lung: Secondary | ICD-10-CM | POA: Diagnosis not present

## 2022-08-21 NOTE — Progress Notes (Signed)
     [address]       [name] [address]             [phone]       Patient: Home Provider: Office Consent for Telemedicine visit obtained.  Today's visit was completed via a real-time telehealth (see specific modality noted below). The patient/authorized person provided oral consent at the time of the visit to engage in a telemedicine encounter with the present provider at [facility]. The patient/authorized person was informed of the potential benefits, limitations, and risks of telemedicine. The patient/authorized person expressed understanding that the laws that protect confidentiality also apply to telemedicine. The patient/authorized person acknowledged understanding that telemedicine does not provide emergency services and that he or she would need to call 911 or proceed to the nearest hospital for help if such a need arose.   Total time spent in the clinical discussion 10 minutes.  Telehealth Modality: Phone visit (audio only)  I had a telephone visit with David Clark.  I explained to him that with a DLCO of 54%, he is at very high risk of requiring lifelong supplemental O2 with lobectomy, and wedge resection.  He stated that he is not willing to take the risk.  I have made a referral to radiation oncology.

## 2022-08-26 ENCOUNTER — Encounter: Payer: Self-pay | Admitting: Radiation Oncology

## 2022-08-26 ENCOUNTER — Ambulatory Visit
Admission: RE | Admit: 2022-08-26 | Discharge: 2022-08-26 | Disposition: A | Discharge status: Home / Self Care | Payer: PPO | Source: Ambulatory Visit | Attending: Radiation Oncology | Admitting: Radiation Oncology

## 2022-08-26 ENCOUNTER — Encounter: Payer: Self-pay | Admitting: *Deleted

## 2022-08-26 VITALS — BP 130/72 | HR 98 | Temp 97.6°F | Resp 16 | Ht 72.0 in | Wt 178.5 lb

## 2022-08-26 DIAGNOSIS — C3411 Malignant neoplasm of upper lobe, right bronchus or lung: Secondary | ICD-10-CM | POA: Diagnosis not present

## 2022-08-26 DIAGNOSIS — Z87891 Personal history of nicotine dependence: Secondary | ICD-10-CM | POA: Diagnosis not present

## 2022-08-26 DIAGNOSIS — F17210 Nicotine dependence, cigarettes, uncomplicated: Secondary | ICD-10-CM | POA: Insufficient documentation

## 2022-08-26 DIAGNOSIS — F17290 Nicotine dependence, other tobacco product, uncomplicated: Secondary | ICD-10-CM | POA: Insufficient documentation

## 2022-08-26 NOTE — Consult Note (Signed)
NEW PATIENT EVALUATION  Name: David Clark  MRN: [id number]  Date:   08/26/2022     DOB: 1955/10/04   This 67 y.o. 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  REFERRING PHYSICIAN: Danella Penton, MD  CHIEF COMPLAINT: No chief complaint on file.   DIAGNOSIS: There were no encounter diagnoses.   PREVIOUS INVESTIGATIONS:  CT scans PET CT scan reviewed Pathology and cytology reports reviewed Clinical notes reviewed  HPI: Patient is a 67 year old male originally presented back in March with increasing shortness of breath.  Workup including CT scan showed a 1.9 cm spiculated nodule in the right upper lobe suspicious for malignancy.  He has had several bouts of pneumonia at this point in time he is doing well specifically denies cough hemoptysis or chest tightness.  PET scan confirmed enlarging subsolid right upper lobe pulmonary nodule hypermetabolic cystic suspicious for adenocarcinoma.  He also had 2 small right lung nodules indeterminate with recommendation for close observation no enlarged or hypermetabolic mediastinal or hilar nodes or any evidence of distant metastatic disease.  He underwent biopsy showing adenocarcinoma with lipidic and acinar growth patterns.  He has been seen by thoracic surgery and based on the fact his DLCO of 54% minimal high risk for surgery and patient has opted for radiation.    PLANNED TREATMENT REGIMEN: SBRT  PAST MEDICAL HISTORY:  has a past medical history of Arthritis, BPH (benign prostatic hyperplasia), Cervical disc disease, COPD (chronic obstructive pulmonary disease) (HCC), COVID, GERD (gastroesophageal reflux disease), Hepatitis B (1993), HLD (hyperlipidemia), Hypertension, Hypomagnesemia, Hyponatremia, Pneumonia, Sepsis (HCC), and Squamous cell carcinoma, arm, left.    PAST SURGICAL HISTORY:  Past Surgical History:  Procedure Laterality Date   ANTERIOR CERVICAL DECOMP/DISCECTOMY  FUSION N/A 01/01/2021   Procedure: C5-6 ANTERIOR CERVICAL DECOMPRESSION/DISCECTOMY FUSION 1 LEVEL;  Surgeon: Venetia Night, MD;  Location: [facility] ORS;  Service: Neurosurgery;  Laterality: N/A;   VIDEO BRONCHOSCOPY WITH ENDOBRONCHIAL ULTRASOUND N/A 07/24/2022   Procedure: VIDEO BRONCHOSCOPY WITH ENDOBRONCHIAL ULTRASOUND;  Surgeon: Vida Rigger, MD;  Location: [facility] ORS;  Service: Thoracic;  Laterality: N/A;    FAMILY HISTORY: family history includes COPD in his mother; Dementia in his father; Diabetes type II in his mother; Heart attack in his father and mother; Heart disease in his sister; Prostate cancer in his father.  SOCIAL HISTORY:  reports that he has been smoking cigarettes and cigars. His smokeless tobacco use includes snuff. He reports current alcohol use of about 42.0 standard drinks of alcohol per week. He reports that he does not currently use drugs after having used the following drugs: Cocaine.  ALLERGIES: Patient has no known allergies.  MEDICATIONS:  Current Outpatient Medications  Medication Sig Dispense Refill   traZODone (DESYREL) 50 MG tablet Take 50 mg by mouth at bedtime.     albuterol (VENTOLIN HFA) 108 (90 Base) MCG/ACT inhaler Inhale 2 puffs into the lungs every 6 (six) hours as needed for wheezing or shortness of breath. 18 g 0   amLODipine (NORVASC) 5 MG tablet Take 5 mg by mouth at bedtime.     colchicine 0.6 MG tablet Take 0.6 mg by mouth 2 (two) times daily as needed (gout flare).     Fluticasone-Umeclidin-Vilant (TRELEGY ELLIPTA) 100-62.5-25 MCG/ACT AEPB Inhale 1 puff into the lungs daily.     ibuprofen (ADVIL) 200 MG tablet Take 200 mg by mouth every 6 (six) hours as needed.     omeprazole (PRILOSEC) 20 MG  capsule Take 20 mg by mouth daily.     tamsulosin (FLOMAX) 0.4 MG CAPS capsule Take 0.4 mg by mouth daily.     No current facility-administered medications for this encounter.    ECOG PERFORMANCE STATUS:  0 - Asymptomatic  REVIEW OF SYSTEMS: Patient  denies any weight loss, fatigue, weakness, fever, chills or night sweats. Patient denies any loss of vision, blurred vision. Patient denies any ringing  of the ears or hearing loss. No irregular heartbeat. Patient denies heart murmur or history of fainting. Patient denies any chest pain or pain radiating to her upper extremities. Patient denies any shortness of breath, difficulty breathing at night, cough or hemoptysis. Patient denies any swelling in the lower legs. Patient denies any nausea vomiting, vomiting of blood, or coffee ground material in the vomitus. Patient denies any stomach pain. Patient states has had normal bowel movements no significant constipation or diarrhea. Patient denies any dysuria, hematuria or significant nocturia. Patient denies any problems walking, swelling in the joints or loss of balance. Patient denies any skin changes, loss of hair or loss of weight. Patient denies any excessive worrying or anxiety or significant depression. Patient denies any problems with insomnia. Patient denies excessive thirst, polyuria, polydipsia. Patient denies any swollen glands, patient denies easy bruising or easy bleeding. Patient denies any recent infections, allergies or URI. Patient "s visual fields have not changed significantly in recent time.   PHYSICAL EXAM: BP 130/72   Pulse 98   Temp 97.6 F (36.4 C)   Resp 16   Ht 6' (1.829 m)   Wt 178 lb 8 oz (81 kg)   BMI 24.21 kg/m  Well-developed well-nourished patient in NAD. HEENT reveals PERLA, EOMI, discs not visualized.  Oral cavity is clear. No oral mucosal lesions are identified. Neck is clear without evidence of cervical or supraclavicular adenopathy. Lungs are clear to A&P. Cardiac examination is essentially unremarkable with regular rate and rhythm without murmur rub or thrill. Abdomen is benign with no organomegaly or masses noted. Motor sensory and DTR levels are equal and symmetric in the upper and lower extremities. Cranial nerves  II through XII are grossly intact. Proprioception is intact. No peripheral adenopathy or edema is identified. No motor or sensory levels are noted. Crude visual fields are within normal range.  LABORATORY DATA: Pathology and cytology reports reviewed    RADIOLOGY RESULTS: CT scans and PET CT scan reviewed compatible with above-stated findings   IMPRESSION: Stage I adenocarcinoma of the right upper lobe in 67 year old male  PLAN: At this time I recommended SBRT to his right upper lobe.  Would plan on delivering 60 Gray in 5 fractions.  Risks and benefits of treatment including extremely low side effect profile for SBRT was reviewed with the patient.  I have personally set up and ordered CT simulation for tomorrow.  Patient comprehends my recommendations well.  I would like to take this opportunity to thank you for allowing me to participate in the care of your patient.Carmina Miller, MD

## 2022-08-26 NOTE — Progress Notes (Signed)
Met with patient during initial consult with Dr. Rushie Chestnut to discuss radiation treatment. All questions answered during visit. Reviewed upcoming appts. Instructed to call with any further questions or needs. Pt verbalized understanding.

## 2022-08-27 ENCOUNTER — Encounter: Payer: Self-pay | Admitting: *Deleted

## 2022-08-27 ENCOUNTER — Ambulatory Visit
Admission: RE | Admit: 2022-08-27 | Discharge: 2022-08-27 | Disposition: A | Discharge status: Home / Self Care | Payer: PPO | Source: Ambulatory Visit | Attending: Radiation Oncology | Admitting: Radiation Oncology

## 2022-08-27 DIAGNOSIS — Z87891 Personal history of nicotine dependence: Secondary | ICD-10-CM | POA: Diagnosis not present

## 2022-08-27 DIAGNOSIS — C3411 Malignant neoplasm of upper lobe, right bronchus or lung: Secondary | ICD-10-CM | POA: Diagnosis not present

## 2022-08-27 DIAGNOSIS — F17210 Nicotine dependence, cigarettes, uncomplicated: Secondary | ICD-10-CM | POA: Diagnosis not present

## 2022-08-27 DIAGNOSIS — F17290 Nicotine dependence, other tobacco product, uncomplicated: Secondary | ICD-10-CM | POA: Diagnosis not present

## 2022-08-27 IMAGING — MR MR CERVICAL SPINE W/O CM
5 series · 39 of 48 positions shown · non-contrast
Comparison: None.

CLINICAL DATA: Neck pain radiating into right shoulder

EXAM:
MRI CERVICAL SPINE WITHOUT CONTRAST
TECHNIQUE: Multiplanar, multisequence MR imaging of the cervical spine was
performed. No intravenous contrast was administered.

[Series 5: T2 · sagittal · 3.0mm · 0.62mm/px · 6 of 15 slices shown (1 of 2)]
[im 1/15]
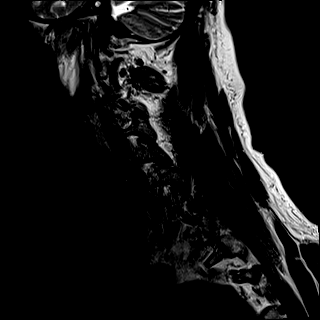
[im 3/15]
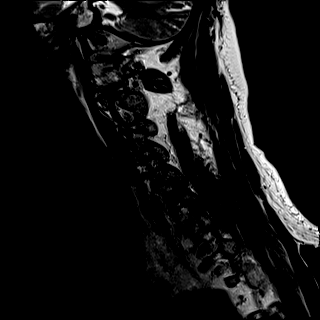
[im 6/15]
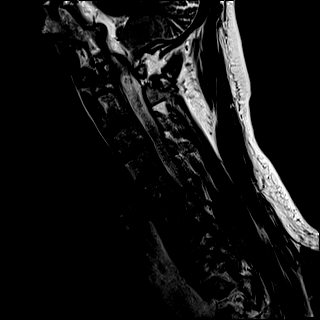
[im 9/15]
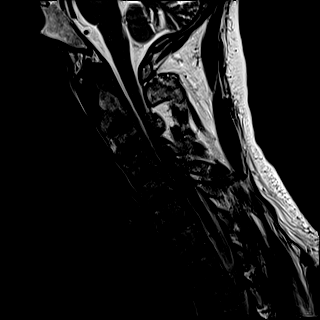
[im 12/15]
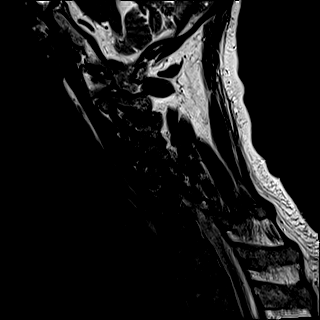
[im 15/15]
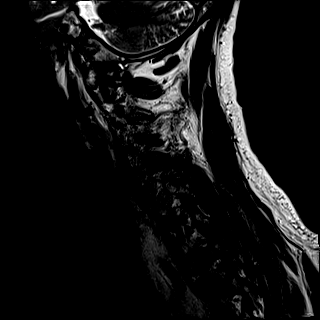

[Series 6: FLAIR · sagittal · 3.0mm · 0.78mm/px · 7 of 15 slices shown]
[im 1/15]
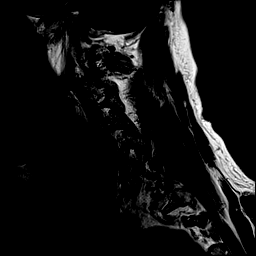
[im 3/15]
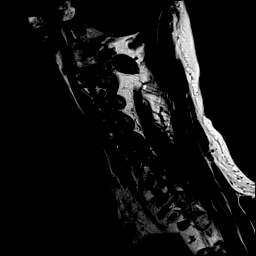
[im 5/15]
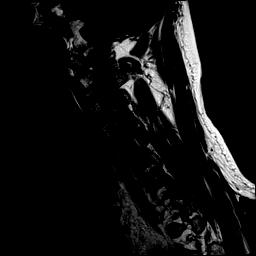
[im 8/15]
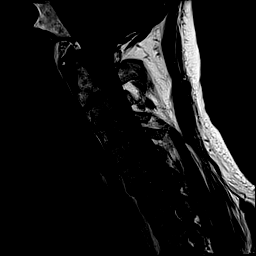
[im 10/15]
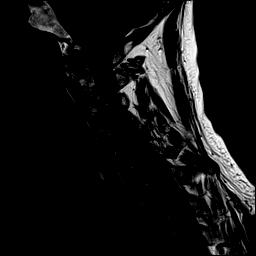
[im 12/15]
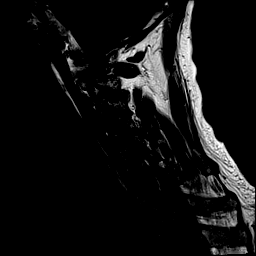
[im 15/15]
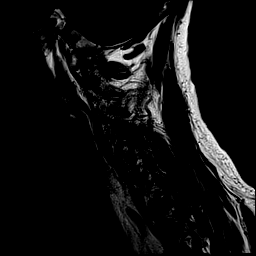

[Series 7: STIR · sagittal · 3.0mm · 0.62mm/px · 7 of 15 slices shown]
[im 1/15]
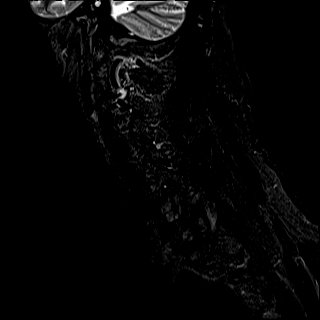
[im 3/15]
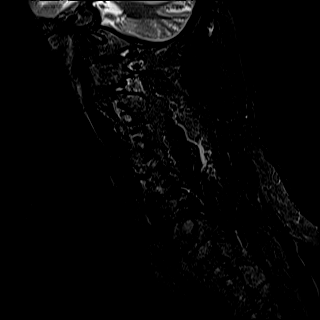
[im 5/15]
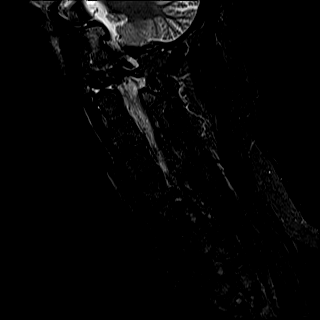
[im 8/15]
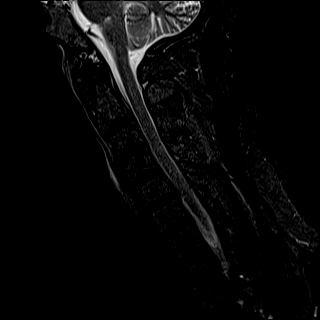
[im 10/15]
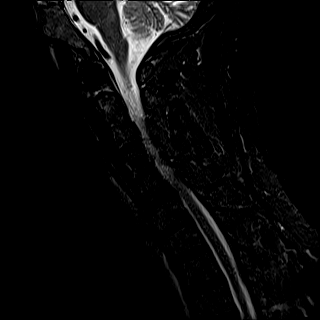
[im 12/15]
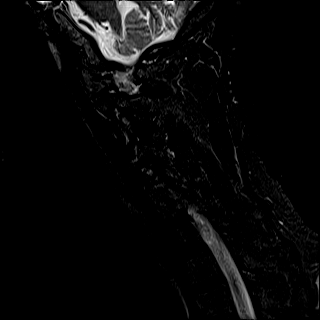
[im 15/15]
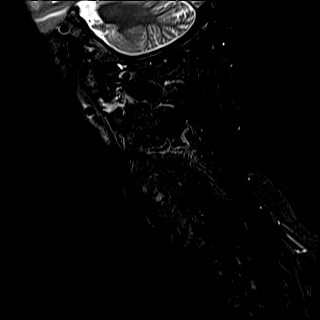

[Series 8: T2 · axial · 3.0mm · 0.70mm/px · z∈[-108,-9]mm · 11 of 32 slices shown (2 of 2)]
[im 1/32]
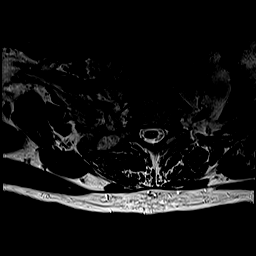
[im 3/32]
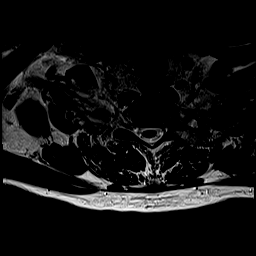
[im 5/32]
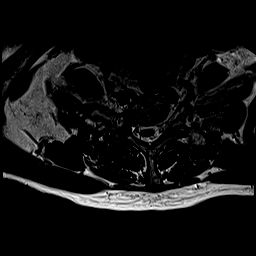
[im 8/32]
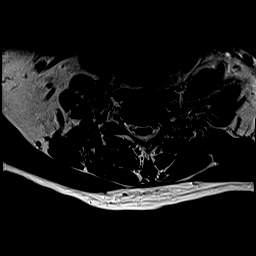
[im 10/32]
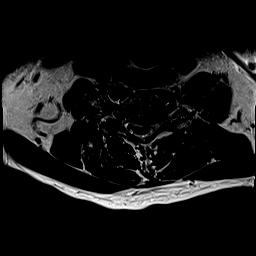
[im 12/32]
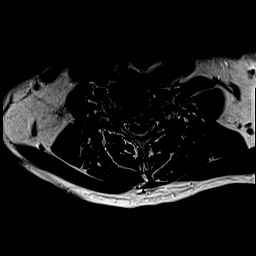
[im 15/32]
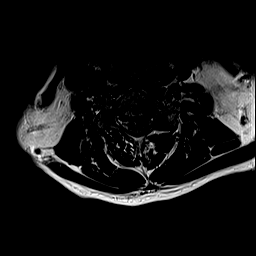
[im 17/32]
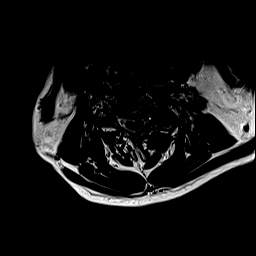
[im 22/32]
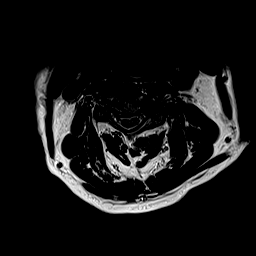
[im 27/32]
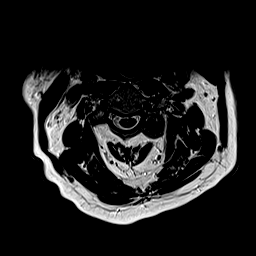
[im 32/32]
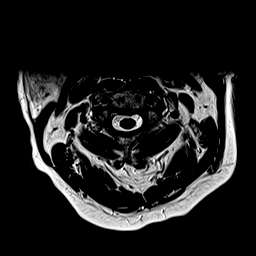

[Series 9: ax mpgr · axial · 3.0mm · 0.35mm/px · z∈[-108,-9]mm · 8 of 32 slices shown]
[im 1/32]
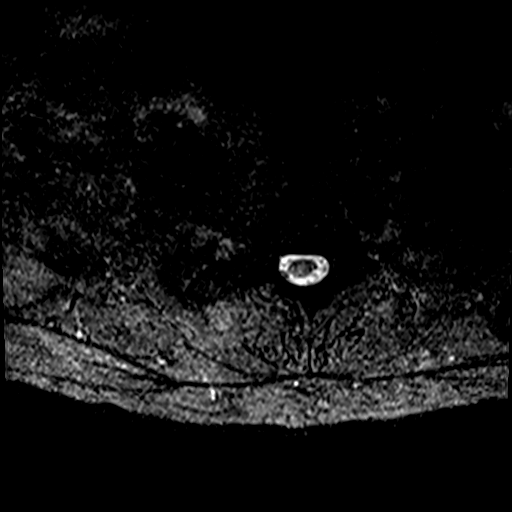
[im 5/32]
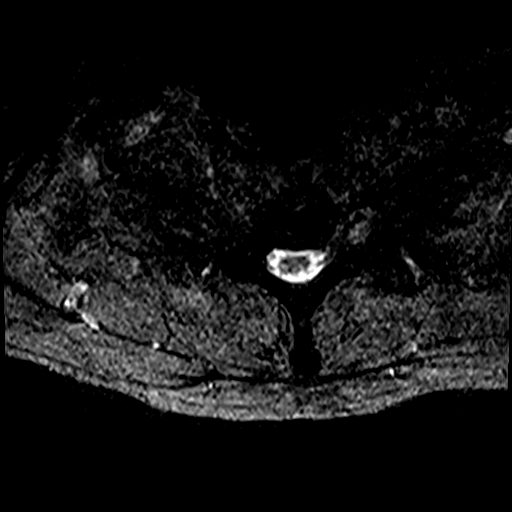
[im 10/32]
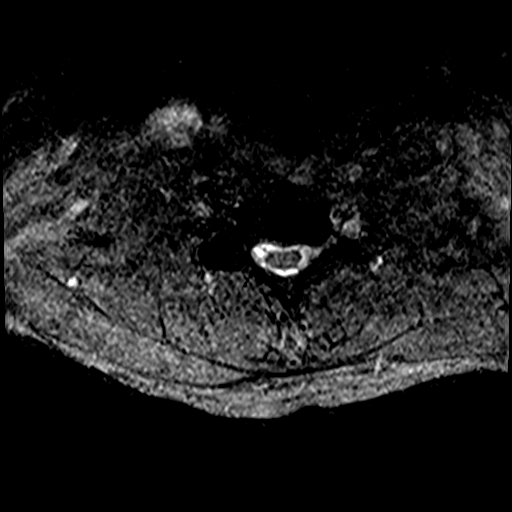
[im 15/32]
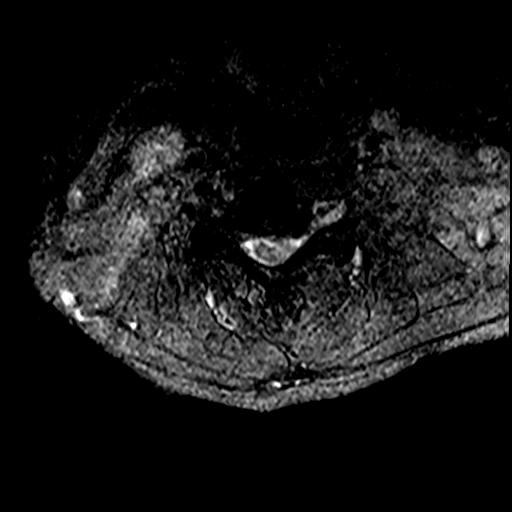
[im 17/32]
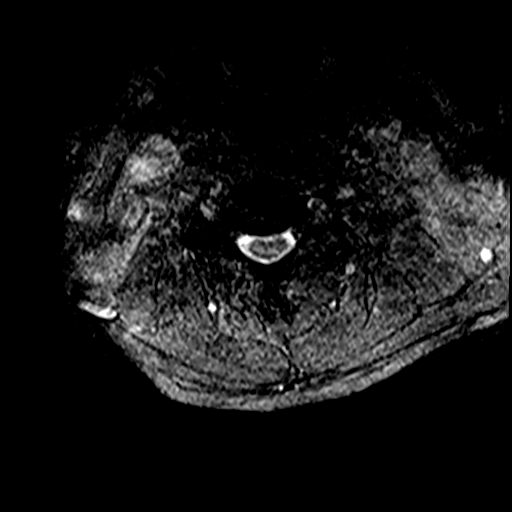
[im 22/32]
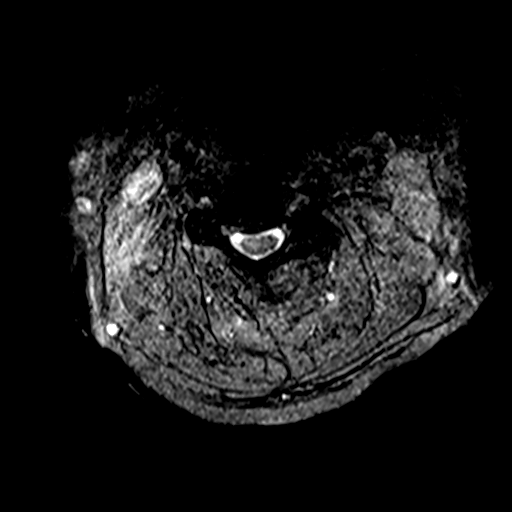
[im 27/32]
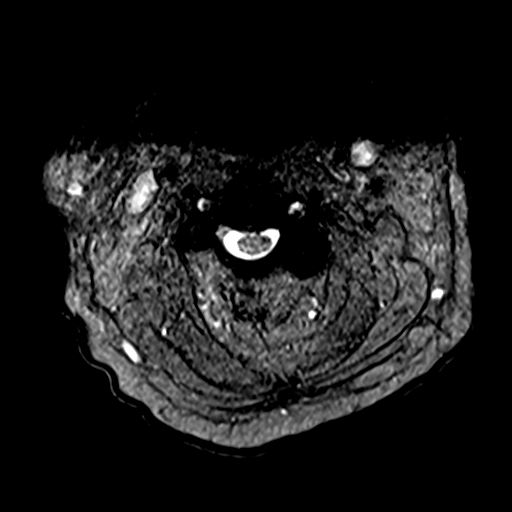
[im 32/32]
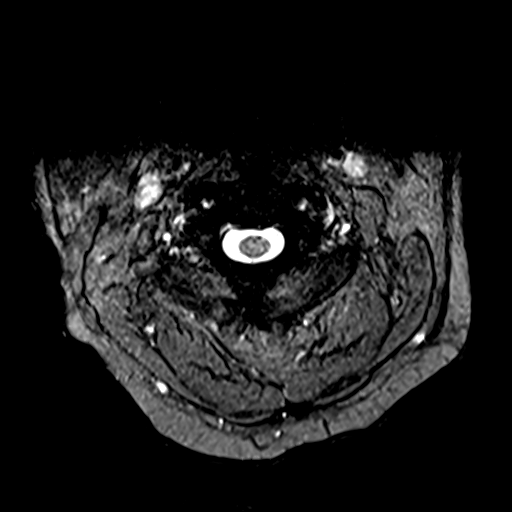

[39 of 48 positions shown; findings below may reference images not displayed]

FINDINGS: Alignment: No significant listhesis.

Vertebrae: Vertebral body heights are maintained. There is no marrow
edema. No suspicious osseous lesion.

Cord: Normal caliber and signal.

Posterior Fossa, vertebral arteries, paraspinal tissues:
Unremarkable.

Disc levels: Congenital narrowing of spinal canal.

C2-C3: Left facet hypertrophy. No significant canal or foraminal
stenosis.

C3-C4: Minimal disc bulge. Mild uncovertebral hypertrophy. Left
facet hypertrophy. Mild canal stenosis. Mild to moderate right and
mild left foraminal stenosis.

C4-C5: Minimal disc bulge. Mild uncovertebral hypertrophy. Left
facet hypertrophy. Minor canal stenosis. Minor foraminal stenosis.

C5-C6: Disc bulge with superimposed left central disc protrusion and
right foraminal disc osteophyte complex. Right uncovertebral
hypertrophy. Moderate canal stenosis. Marked right and mild left
foraminal stenosis.

C6-C7: Minimal disc bulge with superimposed left foraminal disc
osteophyte complex. Left uncovertebral hypertrophy. No canal or
right foraminal stenosis. Mild left foraminal stenosis.

C7-T1:  No canal or foraminal stenosis.
IMPRESSION: Multilevel degenerative changes as detailed above. Canal narrowing
is greatest at C5-C6. Right foraminal narrowing is also greatest at
this level.

## 2022-09-09 DIAGNOSIS — F17210 Nicotine dependence, cigarettes, uncomplicated: Secondary | ICD-10-CM | POA: Insufficient documentation

## 2022-09-09 DIAGNOSIS — F17290 Nicotine dependence, other tobacco product, uncomplicated: Secondary | ICD-10-CM | POA: Diagnosis not present

## 2022-09-09 DIAGNOSIS — C3411 Malignant neoplasm of upper lobe, right bronchus or lung: Secondary | ICD-10-CM | POA: Insufficient documentation

## 2022-09-09 DIAGNOSIS — Z87891 Personal history of nicotine dependence: Secondary | ICD-10-CM | POA: Diagnosis not present

## 2022-09-10 DIAGNOSIS — C3411 Malignant neoplasm of upper lobe, right bronchus or lung: Secondary | ICD-10-CM | POA: Diagnosis not present

## 2022-09-10 DIAGNOSIS — Z87891 Personal history of nicotine dependence: Secondary | ICD-10-CM | POA: Diagnosis not present

## 2022-09-14 ENCOUNTER — Other Ambulatory Visit: Payer: Self-pay

## 2022-09-14 ENCOUNTER — Encounter: Payer: Self-pay | Admitting: *Deleted

## 2022-09-14 ENCOUNTER — Ambulatory Visit
Admission: RE | Admit: 2022-09-14 | Discharge: 2022-09-14 | Disposition: A | Discharge status: Home / Self Care | Payer: PPO | Source: Ambulatory Visit | Attending: Radiation Oncology | Admitting: Radiation Oncology

## 2022-09-14 DIAGNOSIS — C3411 Malignant neoplasm of upper lobe, right bronchus or lung: Secondary | ICD-10-CM | POA: Diagnosis not present

## 2022-09-14 LAB — RAD ONC ARIA SESSION SUMMARY
Course Elapsed Days: 0
Plan Fractions Treated to Date: 1
Plan Prescribed Dose Per Fraction: 12 Gy
Plan Total Fractions Prescribed: 5
Plan Total Prescribed Dose: 60 Gy
Reference Point Dosage Given to Date: 12 Gy
Reference Point Session Dosage Given: 12 Gy
Session Number: 1

## 2022-09-16 ENCOUNTER — Ambulatory Visit
Admission: RE | Admit: 2022-09-16 | Discharge: 2022-09-16 | Disposition: A | Discharge status: Home / Self Care | Payer: PPO | Source: Ambulatory Visit | Attending: Radiation Oncology | Admitting: Radiation Oncology

## 2022-09-16 ENCOUNTER — Other Ambulatory Visit: Payer: Self-pay

## 2022-09-16 DIAGNOSIS — C3411 Malignant neoplasm of upper lobe, right bronchus or lung: Secondary | ICD-10-CM | POA: Diagnosis not present

## 2022-09-16 LAB — RAD ONC ARIA SESSION SUMMARY
Course Elapsed Days: 2
Plan Fractions Treated to Date: 2
Plan Prescribed Dose Per Fraction: 12 Gy
Plan Total Fractions Prescribed: 5
Plan Total Prescribed Dose: 60 Gy
Reference Point Dosage Given to Date: 24 Gy
Reference Point Session Dosage Given: 12 Gy
Session Number: 2

## 2022-09-21 ENCOUNTER — Other Ambulatory Visit: Payer: Self-pay

## 2022-09-21 ENCOUNTER — Ambulatory Visit
Admission: RE | Admit: 2022-09-21 | Discharge: 2022-09-21 | Disposition: A | Discharge status: Home / Self Care | Payer: PPO | Source: Ambulatory Visit | Attending: Radiation Oncology | Admitting: Radiation Oncology

## 2022-09-21 DIAGNOSIS — C3411 Malignant neoplasm of upper lobe, right bronchus or lung: Secondary | ICD-10-CM | POA: Diagnosis not present

## 2022-09-21 LAB — RAD ONC ARIA SESSION SUMMARY
Course Elapsed Days: 7
Plan Fractions Treated to Date: 3
Plan Prescribed Dose Per Fraction: 12 Gy
Plan Total Fractions Prescribed: 5
Plan Total Prescribed Dose: 60 Gy
Reference Point Dosage Given to Date: 36 Gy
Reference Point Session Dosage Given: 12 Gy
Session Number: 3

## 2022-09-23 ENCOUNTER — Other Ambulatory Visit: Payer: Self-pay

## 2022-09-23 ENCOUNTER — Ambulatory Visit
Admission: RE | Admit: 2022-09-23 | Discharge: 2022-09-23 | Disposition: A | Discharge status: Home / Self Care | Payer: PPO | Source: Ambulatory Visit | Attending: Radiation Oncology | Admitting: Radiation Oncology

## 2022-09-23 DIAGNOSIS — C3411 Malignant neoplasm of upper lobe, right bronchus or lung: Secondary | ICD-10-CM | POA: Diagnosis not present

## 2022-09-23 LAB — RAD ONC ARIA SESSION SUMMARY
Course Elapsed Days: 9
Plan Fractions Treated to Date: 4
Plan Prescribed Dose Per Fraction: 12 Gy
Plan Total Fractions Prescribed: 5
Plan Total Prescribed Dose: 60 Gy
Reference Point Dosage Given to Date: 48 Gy
Reference Point Session Dosage Given: 12 Gy
Session Number: 4

## 2022-09-24 ENCOUNTER — Inpatient Hospital Stay: Payer: PPO | Admitting: Oncology

## 2022-09-28 ENCOUNTER — Other Ambulatory Visit: Payer: Self-pay

## 2022-09-28 ENCOUNTER — Ambulatory Visit
Admission: RE | Admit: 2022-09-28 | Discharge: 2022-09-28 | Disposition: A | Discharge status: Home / Self Care | Payer: PPO | Source: Ambulatory Visit | Attending: Radiation Oncology | Admitting: Radiation Oncology

## 2022-09-28 DIAGNOSIS — Z510 Encounter for antineoplastic radiation therapy: Secondary | ICD-10-CM | POA: Diagnosis not present

## 2022-09-28 DIAGNOSIS — Z87891 Personal history of nicotine dependence: Secondary | ICD-10-CM | POA: Diagnosis not present

## 2022-09-28 DIAGNOSIS — C3411 Malignant neoplasm of upper lobe, right bronchus or lung: Secondary | ICD-10-CM | POA: Diagnosis not present

## 2022-09-28 LAB — RAD ONC ARIA SESSION SUMMARY
Course Elapsed Days: 14
Plan Fractions Treated to Date: 5
Plan Prescribed Dose Per Fraction: 12 Gy
Plan Total Fractions Prescribed: 5
Plan Total Prescribed Dose: 60 Gy
Reference Point Dosage Given to Date: 60 Gy
Reference Point Session Dosage Given: 12 Gy
Session Number: 5

## 2022-10-23 ENCOUNTER — Inpatient Hospital Stay: Payer: PPO | Admitting: Oncology

## 2022-10-28 ENCOUNTER — Inpatient Hospital Stay: Payer: PPO | Attending: Oncology | Admitting: Oncology

## 2022-10-28 DIAGNOSIS — C3411 Malignant neoplasm of upper lobe, right bronchus or lung: Secondary | ICD-10-CM | POA: Diagnosis not present

## 2022-10-28 NOTE — Progress Notes (Signed)
[facility]  [phone] [phone]  ID: David Clark OB: [birth year]  MR#: [id number]  QIO#:[id number]  Patient Care Team: Danella Penton, MD as PCP - General (Internal Medicine) Glory Buff, RN as Oncology Nurse Navigator  I connected with David Clark on 10/28/22 at  9:30 AM EDT by video enabled telemedicine visit and verified that I am speaking with the correct person using two identifiers.   I discussed the limitations, risks, security and privacy concerns of performing an evaluation and management service by telemedicine and the availability of in-person appointments. I also discussed with the patient that there may be a patient responsible charge related to this service. The patient expressed understanding and agreed to proceed.   Other persons participating in the visit and their role in the encounter: Patient, MD.  Patient's location: Home. Provider's location: Clinic.  CHIEF COMPLAINT: Stage IA2 adenocarcinoma of the right upper lobe lung.    INTERVAL HISTORY: Patient returns to clinic today for further evaluation several weeks after completion of his XRT.  Previously, it was ultimately determined patient was not a surgical candidate and he proceeded directly to XRT for treatment.  He continues to have a poor appetite and decreased energy.  He otherwise feels well.  He has no neurologic complaints.  He denies any recent fevers.  He denies any chest pain, shortness of breath, cough, or hemoptysis.  He denies any nausea, vomiting, constipation, or diarrhea.  He has no urinary complaints.  Patient offers no further specific complaints today.   REVIEW OF SYSTEMS:   Review of Systems  Constitutional: Negative.  Negative for fever, malaise/fatigue and weight loss.  Respiratory: Negative.  Negative for cough, hemoptysis and shortness of breath.   Cardiovascular: Negative.  Negative for chest pain and leg swelling.  Gastrointestinal:  Negative.  Negative for abdominal pain.  Genitourinary: Negative.  Negative for frequency.  Musculoskeletal: Negative.  Negative for back pain.  Skin: Negative.  Negative for rash.  Neurological: Negative.  Negative for dizziness, focal weakness, weakness and headaches.  Psychiatric/Behavioral: Negative.  The patient is not nervous/anxious.     As per HPI. Otherwise, a complete review of systems is negative.  PAST MEDICAL HISTORY: Past Medical History:  Diagnosis Date   Arthritis    BPH (benign prostatic hyperplasia)    Cervical disc disease    COPD (chronic obstructive pulmonary disease) (HCC)    COVID    GERD (gastroesophageal reflux disease)    Hepatitis B 1993   HLD (hyperlipidemia)    Hypertension    Hypomagnesemia    Hyponatremia    Pneumonia    Sepsis (HCC)    Squamous cell carcinoma, arm, left     PAST SURGICAL HISTORY: Past Surgical History:  Procedure Laterality Date   ANTERIOR CERVICAL DECOMP/DISCECTOMY FUSION N/A 01/01/2021   Procedure: C5-6 ANTERIOR CERVICAL DECOMPRESSION/DISCECTOMY FUSION 1 LEVEL;  Surgeon: Venetia Night, MD;  Location: [facility] ORS;  Service: Neurosurgery;  Laterality: N/A;   VIDEO BRONCHOSCOPY WITH ENDOBRONCHIAL ULTRASOUND N/A 07/24/2022   Procedure: VIDEO BRONCHOSCOPY WITH ENDOBRONCHIAL ULTRASOUND;  Surgeon: Vida Rigger, MD;  Location: [facility] ORS;  Service: Thoracic;  Laterality: N/A;    FAMILY HISTORY: Family History  Problem Relation Age of Onset   Diabetes type II Mother    COPD Mother    Heart attack Mother    Heart attack Father    Dementia Father    Prostate cancer Father    Heart disease Sister     ADVANCED DIRECTIVES (Y/N):  N  HEALTH MAINTENANCE: Social History   Tobacco Use   Smoking status: Some Days    Current packs/day: 0.00    Types: Cigarettes, Cigars    Last attempt to quit: 1992    Years since quitting: 32.5   Smokeless tobacco: Current    Types: Snuff  Vaping Use   Vaping status: Never Used   Substance Use Topics   Alcohol use: Yes    Alcohol/week: 42.0 standard drinks of alcohol    Types: 42 Cans of beer per week    Comment: 6 pk day   Drug use: Not Currently    Types: Cocaine    Comment: IV use 1980"s     Colonoscopy:  PAP:  Bone density:  Lipid panel:  No Known Allergies  Current Outpatient Medications  Medication Sig Dispense Refill   albuterol (VENTOLIN HFA) 108 (90 Base) MCG/ACT inhaler Inhale 2 puffs into the lungs every 6 (six) hours as needed for wheezing or shortness of breath. 18 g 0   amLODipine (NORVASC) 5 MG tablet Take 5 mg by mouth at bedtime.     colchicine 0.6 MG tablet Take 0.6 mg by mouth 2 (two) times daily as needed (gout flare).     Fluticasone-Umeclidin-Vilant (TRELEGY ELLIPTA) 100-62.5-25 MCG/ACT AEPB Inhale 1 puff into the lungs daily.     ibuprofen (ADVIL) 200 MG tablet Take 200 mg by mouth every 6 (six) hours as needed.     omeprazole (PRILOSEC) 20 MG capsule Take 20 mg by mouth daily.     tamsulosin (FLOMAX) 0.4 MG CAPS capsule Take 0.4 mg by mouth daily.     traZODone (DESYREL) 50 MG tablet Take 50 mg by mouth at bedtime.     No current facility-administered medications for this visit.    OBJECTIVE: There were no vitals filed for this visit.    There is no height or weight on file to calculate BMI.    ECOG FS:0 - Asymptomatic  General: Well-developed, well-nourished, no acute distress. HEENT: Normocephalic. Neuro: Alert, answering all questions appropriately. Cranial nerves grossly intact. Psych: Normal affect.  LAB RESULTS:  Lab Results  Component Value Date   NA 140 06/30/2022   K 3.6 06/30/2022   CL 109 06/30/2022   CO2 24 06/30/2022   GLUCOSE 118 (H) 06/30/2022   BUN 18 06/30/2022   CREATININE 0.79 06/30/2022   CALCIUM 8.6 (L) 06/30/2022   PROT 7.8 06/28/2022   ALBUMIN 4.4 06/28/2022   AST 47 (H) 06/28/2022   ALT 34 06/28/2022   ALKPHOS 73 06/28/2022   BILITOT 2.3 (H) 06/28/2022   GFRNONAA >60 06/30/2022     Lab Results  Component Value Date   WBC 6.0 07/22/2022   NEUTROABS 8.9 (H) 06/30/2022   HGB 16.9 07/22/2022   HCT 45.6 07/22/2022   MCV 96.8 07/22/2022   PLT 201 07/22/2022     STUDIES: No results found.  ASSESSMENT: Stage IA2 adenocarcinoma of the right upper lobe lung.  PLAN:    Stage IA2 adenocarcinoma of the right upper lobe lung: Diagnosis confirmed by bronchoscopy on July 24, 2022.  PET scan results from July 08, 2022 reviewed independently with no obvious evidence of disease outside his known pulmonary nodule.  It was determined patient was not a surgical candidate and he proceeded directly with XRT completing in June 2024.  No intervention is needed at this time.  Return to clinic in 2 months with repeat PET scan and further evaluation.  If PET scan is improved, can transition  to imaging with CT scan every 6 months.  Poor appetite/decreased energy: Patient was given a referral to dietary.  I provided 20 minutes of face-to-face video visit time during this encounter which included chart review, counseling, and coordination of care as documented above.   Patient expressed understanding and was in agreement with this plan. He also understands that He can call clinic at any time with any questions, concerns, or complaints.    Cancer Staging  Adenocarcinoma, lung, right [facility]) Staging form: Lung, AJCC 8th Edition - Clinical stage from 07/30/2022: Stage IA2 (cT1b, cN0, cM0) - Signed by Jeralyn Ruths, MD on 07/30/2022 Stage prefix: Initial diagnosis   Jeralyn Ruths, MD   10/28/2022 12:26 PM

## 2022-11-04 ENCOUNTER — Inpatient Hospital Stay: Payer: PPO

## 2022-11-04 NOTE — Progress Notes (Addendum)
Nutrition Assessment   Reason for Assessment:  Referral for weight loss, decreased appetite.    ASSESSMENT:  67 year old male with stage Ia adenocarcinoma of right upper lobe.  Received 5 radiation treatments which ended on 6/24.  Patient not a surgical candidate.  Past medical history of COPD, GERD, Hepatitis B, HLD, HTN, COVID.    Met with patient in clinic.  Reports that his appetite is poor.  Today I have only eaten celery sticks and ranch dressing.  Yesterday says he ate 6 chicken wings.  Sometimes eats peanut butter nabs.  Overall says that he eats about 1 meal a day.  States that he drinks at least 12 beers daily. Reports that he has someone that prepares meals for him.  Shares that he started drinking again after 29 years.  He has food when needs it.  Denies nausea, diarrhea, constipation, trouble chewing or swallowing foods.     Nutrition Focused Physical Exam:   Orbital Region: mild Buccal Region: mild Upper Arm Region: mild Thoracic and Lumbar Region: normal Temple Region: normal Clavicle Bone Region: normal Shoulder and Acromion Bone Region: normal Scapular Bone Region: normal Dorsal Hand: normal Patellar Region: normal Anterior Thigh Region: mild Posterior Calf Region: normal Edema (RD assessment): none    Medications: prilosec, ibuprofen    Labs: reviewed   Anthropometrics:   Height: 72 inches Weight: 188 lb 5 oz  178 lb 8 oz on 5/22 08/14/22 186 lb UBW: 194 lb  Noted 193 lb on 07/10/2022 BMI: 24  3% weight loss in the last 4 months, concerning   Estimated Energy Needs  Kcals: 2125-2500 Protein: 106-125 g Fluid: 2125-2500 ml   NUTRITION DIAGNOSIS: Inadequate oral intake related to Etoh use, cancer treatment as evidenced by 3% weight loss in 4 months and decreased intake   INTERVENTION:  Discussed option of trial of appetite stimulant.  Patient agreed to trial and will send message to Dr Orlie Dakin and team Encouraged eating q 2-3 hours but  patient felt he could only nibble q 4 hours.   Discussed snack options including good sources of protein.  Handout provided on High Calorie, High Protein diet Encouraged oral nutrition supplement. Samples of ensure complete given to patient to try along with coupons Encouraged daily MVI. Encouraged reducing Etoh intake.   Contact information provided    MONITORING, EVALUATION, GOAL: weight trends, intake   Next Visit: Tuesday, Oct 1 following MD appt Offered to see patient before Oct 1 but stated that he has to work  [employer] B. Freida Busman, RD, LDN Registered Dietitian [phone]

## 2022-11-05 ENCOUNTER — Other Ambulatory Visit: Payer: Self-pay | Admitting: *Deleted

## 2022-11-05 ENCOUNTER — Encounter: Payer: Self-pay | Admitting: Radiation Oncology

## 2022-11-05 ENCOUNTER — Ambulatory Visit
Admission: RE | Admit: 2022-11-05 | Discharge: 2022-11-05 | Disposition: A | Discharge status: Home / Self Care | Payer: PPO | Source: Ambulatory Visit | Attending: Radiation Oncology | Admitting: Radiation Oncology

## 2022-11-05 VITALS — BP 129/86 | HR 103 | Temp 96.8°F | Resp 16 | Wt 181.3 lb

## 2022-11-05 DIAGNOSIS — C3412 Malignant neoplasm of upper lobe, left bronchus or lung: Secondary | ICD-10-CM | POA: Insufficient documentation

## 2022-11-05 DIAGNOSIS — C3411 Malignant neoplasm of upper lobe, right bronchus or lung: Secondary | ICD-10-CM | POA: Insufficient documentation

## 2022-11-05 MED ORDER — MEGESTROL ACETATE 40 MG PO TABS: 40.0000 mg | ORAL_TABLET | Freq: Every day | ORAL | 2 refills | Status: DC

## 2022-11-05 NOTE — Progress Notes (Signed)
Radiation Oncology Follow up Note  Name: David Clark   Date:   11/05/2022 MRN:  [id number] DOB: [birth year]    This 67 y.o. male presents to the clinic today for 1 month follow-up status post SBRT for stage I adenocarcinoma the right upper lobe.  REFERRING PROVIDER: Danella Penton, MD  HPI: Patient is a 67 year old male now out 1 month having completed SBRT to his right upper lobe for stage I non-small cell lung cancer adenocarcinoma.  He.  He states he continues to have problems with breathing is running out of his inhaler prescriptions.  Specifically [place] cough hemoptysis or chest tightness..  COMPLICATIONS OF TREATMENT: none  FOLLOW UP COMPLIANCE: keeps appointments   PHYSICAL EXAM:  There were no vitals taken for this visit. Well-developed well-nourished patient in NAD. HEENT reveals PERLA, EOMI, discs not visualized.  Oral cavity is clear. No oral mucosal lesions are identified. Neck is clear without evidence of cervical or supraclavicular adenopathy. Lungs are clear to A&P. Cardiac examination is essentially unremarkable with regular rate and rhythm without murmur rub or thrill. Abdomen is benign with no organomegaly or masses noted. Motor sensory and DTR levels are equal and symmetric in the upper and lower extremities. Cranial nerves II through XII are grossly intact. Proprioception is intact. No peripheral adenopathy or edema is identified. No motor or sensory levels are noted. Crude visual fields are within normal range.  RADIOLOGY RESULTS: Patient has scheduled CT scan and PET CT scan in September.  PLAN: I have asked the patient to be seen back in 3 months for follow-up.  Of asked him to contact his pulmonologist Dr. Deatra Ina for refill of his prescriptions and possible follow-up for his pulmonary status.  Patient knows to call with any concerns.  I would like to take this opportunity to thank you for allowing me to participate in the care of your patient.Carmina Miller,  MD

## 2022-11-06 ENCOUNTER — Other Ambulatory Visit: Payer: PPO

## 2022-11-06 ENCOUNTER — Inpatient Hospital Stay: Payer: PPO | Attending: Oncology

## 2022-11-06 NOTE — Progress Notes (Signed)
[facility] Clinical Social Work  Clinical Social Work was referred by Alphonse Guild, Dietician  for assessment of psychosocial needs.  Clinical Social Worker contacted patient by phone to offer support and assess for needs.    Patient stated he began drinking beer six months ago due to increased stressors in his life.  He stopped drinking 29 years ago without assistance.  He stated his girlfriend is concerned about his alcohol consumption, but he is not.  "I know what I need to do."  Patient expressed no other issues.  He permitted CSW to mail him contact information for future use.     Dorothey Baseman, LCSW  Clinical Social Worker [facility]

## 2022-12-29 ENCOUNTER — Ambulatory Visit
Admission: RE | Admit: 2022-12-29 | Discharge: 2022-12-29 | Disposition: A | Discharge status: Home / Self Care | Payer: PPO | Source: Ambulatory Visit | Attending: Oncology | Admitting: Oncology

## 2022-12-29 DIAGNOSIS — C3411 Malignant neoplasm of upper lobe, right bronchus or lung: Secondary | ICD-10-CM | POA: Insufficient documentation

## 2022-12-29 DIAGNOSIS — C3490 Malignant neoplasm of unspecified part of unspecified bronchus or lung: Secondary | ICD-10-CM | POA: Diagnosis not present

## 2022-12-29 DIAGNOSIS — J449 Chronic obstructive pulmonary disease, unspecified: Secondary | ICD-10-CM | POA: Diagnosis not present

## 2022-12-29 LAB — GLUCOSE, CAPILLARY: Glucose-Capillary: 105 mg/dL — ABNORMAL HIGH (ref 70–99)

## 2022-12-29 MED ORDER — FLUDEOXYGLUCOSE F - 18 (FDG) INJECTION
9.4000 | Freq: Once | INTRAVENOUS | Status: AC | PRN
  Administered 2022-12-29: 10.13 via INTRAVENOUS

## 2023-01-04 ENCOUNTER — Other Ambulatory Visit: Payer: Self-pay

## 2023-01-04 DIAGNOSIS — C3411 Malignant neoplasm of upper lobe, right bronchus or lung: Secondary | ICD-10-CM

## 2023-01-05 ENCOUNTER — Inpatient Hospital Stay: Payer: PPO | Attending: Oncology

## 2023-01-05 ENCOUNTER — Inpatient Hospital Stay: Payer: PPO

## 2023-01-05 ENCOUNTER — Ambulatory Visit: Payer: PPO | Attending: Radiation Oncology | Admitting: Radiation Oncology

## 2023-01-05 ENCOUNTER — Inpatient Hospital Stay: Payer: PPO | Admitting: Oncology

## 2023-01-05 VITALS — BP 139/90 | HR 96 | Temp 98.7°F | Wt 185.0 lb

## 2023-01-05 DIAGNOSIS — F17210 Nicotine dependence, cigarettes, uncomplicated: Secondary | ICD-10-CM | POA: Insufficient documentation

## 2023-01-05 DIAGNOSIS — C3411 Malignant neoplasm of upper lobe, right bronchus or lung: Secondary | ICD-10-CM | POA: Insufficient documentation

## 2023-01-05 DIAGNOSIS — F17290 Nicotine dependence, other tobacco product, uncomplicated: Secondary | ICD-10-CM | POA: Diagnosis not present

## 2023-01-05 DIAGNOSIS — R531 Weakness: Secondary | ICD-10-CM | POA: Insufficient documentation

## 2023-01-05 LAB — CBC WITH DIFFERENTIAL (CANCER CENTER ONLY)
Abs Immature Granulocytes: 0.03 10*3/uL (ref 0.00–0.07)
Basophils Absolute: 0 10*3/uL (ref 0.0–0.1)
Basophils Relative: 1 %
Eosinophils Absolute: 0 10*3/uL (ref 0.0–0.5)
Eosinophils Relative: 1 %
HCT: 40 % (ref 39.0–52.0)
Hemoglobin: 15 g/dL (ref 13.0–17.0)
Immature Granulocytes: 1 %
Lymphocytes Relative: 35 %
Lymphs Abs: 1.9 10*3/uL (ref 0.7–4.0)
MCH: 37.5 pg — ABNORMAL HIGH (ref 26.0–34.0)
MCHC: 37.5 g/dL — ABNORMAL HIGH (ref 30.0–36.0)
MCV: 100 fL (ref 80.0–100.0)
Monocytes Absolute: 0.4 10*3/uL (ref 0.1–1.0)
Monocytes Relative: 8 %
Neutro Abs: 3.1 10*3/uL (ref 1.7–7.7)
Neutrophils Relative %: 54 %
Platelet Count: 199 10*3/uL (ref 150–400)
RBC: 4 MIL/uL — ABNORMAL LOW (ref 4.22–5.81)
RDW: 14.1 % (ref 11.5–15.5)
WBC Count: 5.6 10*3/uL (ref 4.0–10.5)
nRBC: 0 % (ref 0.0–0.2)

## 2023-01-05 LAB — CMP (CANCER CENTER ONLY)
ALT: 31 U/L (ref 0–44)
AST: 40 U/L (ref 15–41)
Albumin: 4.2 g/dL (ref 3.5–5.0)
Alkaline Phosphatase: 78 U/L (ref 38–126)
Anion gap: 10 (ref 5–15)
BUN: 9 mg/dL (ref 8–23)
CO2: 26 mmol/L (ref 22–32)
Calcium: 9.4 mg/dL (ref 8.9–10.3)
Chloride: 100 mmol/L (ref 98–111)
Creatinine: 0.8 mg/dL (ref 0.61–1.24)
GFR, Estimated: >60 mL/min
Glucose, Bld: 112 mg/dL — ABNORMAL HIGH (ref 70–99)
Potassium: 4.7 mmol/L (ref 3.5–5.1)
Sodium: 136 mmol/L (ref 135–145)
Total Bilirubin: 1 mg/dL (ref 0.3–1.2)
Total Protein: 8.1 g/dL (ref 6.5–8.1)

## 2023-01-05 NOTE — Progress Notes (Signed)
[facility]  [phone] [phone]  ID: David Clark OB: 1955-11-22  MR#: [id number]  ION#:[id number]  Patient Care Team: Danella Penton, MD as PCP - General (Internal Medicine) Glory Buff, RN as Oncology Nurse Navigator   CHIEF COMPLAINT: Stage IA2 adenocarcinoma of the right upper lobe lung.    INTERVAL HISTORY: Patient returns to clinic today for further evaluation and discussion of his PET scan results.  He has increased weakness and fatigue, but states he feels this is secondary to having COVID 3 separate times this calendar year.  He otherwise feels well. He has no neurologic complaints.  He denies any recent fevers.  He denies any chest pain, shortness of breath, cough, or hemoptysis.  He denies any nausea, vomiting, constipation, or diarrhea.  He has no urinary complaints.  Patient offers no further specific complaints today.  REVIEW OF SYSTEMS:   Review of Systems  Constitutional:  Positive for malaise/fatigue. Negative for fever and weight loss.  Respiratory: Negative.  Negative for cough, hemoptysis and shortness of breath.   Cardiovascular: Negative.  Negative for chest pain and leg swelling.  Gastrointestinal: Negative.  Negative for abdominal pain.  Genitourinary: Negative.  Negative for frequency.  Musculoskeletal: Negative.  Negative for back pain.  Skin: Negative.  Negative for rash.  Neurological:  Positive for weakness. Negative for dizziness, focal weakness and headaches.  Psychiatric/Behavioral: Negative.  The patient is not nervous/anxious.     As per HPI. Otherwise, a complete review of systems is negative.  PAST MEDICAL HISTORY: Past Medical History:  Diagnosis Date   Arthritis    BPH (benign prostatic hyperplasia)    Cervical disc disease    COPD (chronic obstructive pulmonary disease) (HCC)    COVID    GERD (gastroesophageal reflux disease)    Hepatitis B 1993   HLD (hyperlipidemia)    Hypertension     Hypomagnesemia    Hyponatremia    Pneumonia    Sepsis (HCC)    Squamous cell carcinoma, arm, left     PAST SURGICAL HISTORY: Past Surgical History:  Procedure Laterality Date   ANTERIOR CERVICAL DECOMP/DISCECTOMY FUSION N/A 01/01/2021   Procedure: C5-6 ANTERIOR CERVICAL DECOMPRESSION/DISCECTOMY FUSION 1 LEVEL;  Surgeon: Venetia Night, MD;  Location: [facility] ORS;  Service: Neurosurgery;  Laterality: N/A;   VIDEO BRONCHOSCOPY WITH ENDOBRONCHIAL ULTRASOUND N/A 07/24/2022   Procedure: VIDEO BRONCHOSCOPY WITH ENDOBRONCHIAL ULTRASOUND;  Surgeon: Vida Rigger, MD;  Location: [facility] OR[facility];  Service: Thoracic;  Laterality: N/A;    FAMILY HISTORY: Family History  Problem Relation Age of Onset   Diabetes type II Mother    COPD Mother    Heart attack Mother    Heart attack Father    Dementia Father    Prostate cancer Father    Heart disease Sister     ADVANCED DIRECTIVES (Y/N):  N  HEALTH MAINTENANCE: Social History   Tobacco Use   Smoking status: Some Days    Current packs/day: 0.00    Types: Cigarettes, Cigars    Last attempt to quit: 1992    Years since quitting: 32.7   Smokeless tobacco: Current    Types: Snuff  Vaping Use   Vaping status: Never Used  Substance Use Topics   Alcohol use: Yes    Alcohol/week: 42.0 standard drinks of alcohol    Types: 42 Cans of beer per week    Comment: 6 pk day   Drug use: Not Currently    Types: Cocaine    Comment: IV  use 1980"s     Colonoscopy:  PAP:  Bone density:  Lipid panel:  No Known Allergies  Current Outpatient Medications  Medication Sig Dispense Refill   amLODipine (NORVASC) 5 MG tablet Take 5 mg by mouth at bedtime.     Fluticasone-Umeclidin-Vilant (TRELEGY ELLIPTA) 100-62.5-25 MCG/ACT AEPB Inhale 1 puff into the lungs daily.     ibuprofen (ADVIL) 200 MG tablet Take 200 mg by mouth every 6 (six) hours as needed.     omeprazole (PRILOSEC) 20 MG capsule Take 20 mg by mouth daily.     tamsulosin (FLOMAX) 0.4 MG CAPS  capsule Take 0.4 mg by mouth daily.     albuterol (VENTOLIN HFA) 108 (90 Base) MCG/ACT inhaler Inhale 2 puffs into the lungs every 6 (six) hours as needed for wheezing or shortness of breath. (Patient not taking: Reported on 11/05/2022) 18 g 0   colchicine 0.6 MG tablet Take 0.6 mg by mouth 2 (two) times daily as needed (gout flare). (Patient not taking: Reported on 11/05/2022)     megestrol (MEGACE) 40 MG tablet Take 1 tablet (40 mg total) by mouth daily. (Patient not taking: Reported on 11/05/2022) 30 tablet 2   traZODone (DESYREL) 50 MG tablet Take 50 mg by mouth at bedtime. (Patient not taking: Reported on 11/05/2022)     No current facility-administered medications for this visit.    OBJECTIVE: Vitals:   01/05/23 0944  BP: (!) 139/90  Pulse: 96  Temp: 98.7 F (37.1 C)  SpO2: 100%      Body mass index is 25.09 kg/m.    ECOG FS:0 - Asymptomatic  General: Well-developed, well-nourished, no acute distress. Eyes: Pink conjunctiva, anicteric sclera. HEENT: Normocephalic, moist mucous membranes. Lungs: No audible wheezing or coughing. Heart: Regular rate and rhythm. Abdomen: Soft, nontender, no obvious distention. Musculoskeletal: No edema, cyanosis, or clubbing. Neuro: Alert, answering all questions appropriately. Cranial nerves grossly intact. Skin: No rashes or petechiae noted. Psych: Normal affect.  LAB RESULTS:  Lab Results  Component Value Date   NA 136 01/05/2023   K 4.7 01/05/2023   CL 100 01/05/2023   CO2 26 01/05/2023   GLUCOSE 112 (H) 01/05/2023   BUN 9 01/05/2023   CREATININE 0.80 01/05/2023   CALCIUM 9.4 01/05/2023   PROT 8.1 01/05/2023   ALBUMIN 4.2 01/05/2023   AST 40 01/05/2023   ALT 31 01/05/2023   ALKPHOS 78 01/05/2023   BILITOT 1.0 01/05/2023   GFRNONAA >60 01/05/2023    Lab Results  Component Value Date   WBC 5.6 01/05/2023   NEUTROABS 3.1 01/05/2023   HGB 15.0 01/05/2023   HCT 40.0 01/05/2023   MCV 100.0 01/05/2023   PLT 199 01/05/2023      STUDIES: NM PET Image Restag (PS) Skull Base To Thigh  Result Date: 01/05/2023 CLINICAL DATA:  Subsequent treatment strategy for lung cancer. EXAM: NUCLEAR MEDICINE PET SKULL BASE TO THIGH TECHNIQUE: 10.1 mCi F-18 FDG was injected intravenously. Full-ring PET imaging was performed from the skull base to thigh after the radiotracer. CT data was obtained and used for attenuation correction and anatomic localization. Fasting blood glucose: 105 mg/dl COMPARISON:  PET-CT 08/65/7846 FINDINGS: Mediastinal blood pool activity: SUV max 2.2 Liver activity: SUV max NA NECK: No hypermetabolic lymph nodes in the neck. Incidental CT findings: None. CHEST: No evidence for hypermetabolic mediastinal or hilar lymphadenopathy 2.9 cm mixed attenuation lesion in the right upper lobe on the previous study is 2.6 cm today on image 56/6. Hypermetabolism seen in this lesion previously  is minimally decreased with SUV max = 2.8 today compared to 3.8 previously. The 7 mm medial right upper lobe pulmonary nodule seen previously has resolved in the interval. 8 mm posterior right lung nodule seen on the previous study has also resolved. New ground-glass opacity in the parahilar posterior left upper lobe (57/6) shows low level FDG uptake with SUV max = 2.1. A new subtle focus of nodular ground-glass opacity is seen in the right upper lobe on image 51/6. This demonstrates low level FDG accumulation with SUV max = 1.6. Incidental CT findings: Enlargement of the pulmonary outflow tract and main pulmonary arteries suggests pulmonary arterial hypertension. Coronary artery calcification is evident. ABDOMEN/PELVIS: No abnormal hypermetabolic activity within the liver, pancreas, adrenal glands, or spleen. No hypermetabolic lymph nodes in the abdomen or pelvis. Incidental CT findings: None. SKELETON: No focal hypermetabolic activity to suggest skeletal metastasis. Incidental CT findings: No worrisome lytic or sclerotic osseous abnormality.  IMPRESSION: 1. No substantial change in the mixed attenuation right upper lobe lesion of concern on the previous study. Lesion measures minimally smaller in shows mildly decreased FDG accumulation on today's study. 2. Interval resolution of the 2 intermediate subcentimeter right lung nodules seen previously. 3. New ground-glass opacities in the parahilar left upper lobe and right upper lobe with low level FDG accumulation. Imaging features are nonspecific and may be infectious/inflammatory. 4. No evidence for hypermetabolic mediastinal or hilar lymphadenopathy. No evidence for distant metastatic disease in the abdomen or pelvis. Electronically Signed   By: Kennith Center M.D.   On: 01/05/2023 08:04    ASSESSMENT: Stage IA2 adenocarcinoma of the right upper lobe lung.  PLAN:    Stage IA2 adenocarcinoma of the right upper lobe lung: Diagnosis confirmed by bronchoscopy on July 24, 2022.  PET scan results from July 08, 2022 reviewed independently with no obvious evidence of disease outside his known pulmonary nodule.  It was determined patient was not a surgical candidate and he proceeded directly with XRT completing in June 2024.  Repeat PET scan on January 05, 2023 reviewed independently and report as above revealed improvement of disease burden.  Suspect his residual hypermetabolism is secondary to recent COVID infection.  No further intervention is needed.  Return to clinic in 3 months with repeat CT scan and further evaluation.   Poor appetite/decreased energy: Possibly secondary to repeated COVID infections.  Patient declined dietary referral.  He plans to follow-up with his primary care physician.  I spent a total of 20 minutes reviewing chart data, face-to-face evaluation with the patient, counseling and coordination of care as detailed above.   Patient expressed understanding and was in agreement with this plan. He also understands that He can call clinic at any time with any questions, concerns, or  complaints.    Cancer Staging  Adenocarcinoma, lung, right [facility]) Staging form: Lung, AJCC 8th Edition - Clinical stage from 07/30/2022: Stage IA2 (cT1b, cN0, cM0) - Signed by Jeralyn Ruths, MD on 07/30/2022 Stage prefix: Initial diagnosis   Jeralyn Ruths, MD   01/05/2023 1:00 PM

## 2023-01-05 NOTE — Progress Notes (Signed)
Nutrition  Patient declined nutrition follow-up today after MD visit.   RD available if needed.  Rayshaun Needle B. Freida Busman, RD, LDN Registered Dietitian [phone]

## 2023-02-08 ENCOUNTER — Ambulatory Visit
Admission: RE | Admit: 2023-02-08 | Discharge: 2023-02-08 | Disposition: A | Discharge status: Home / Self Care | Payer: PPO | Source: Ambulatory Visit | Attending: Radiation Oncology | Admitting: Radiation Oncology

## 2023-02-08 ENCOUNTER — Encounter: Payer: Self-pay | Admitting: Radiation Oncology

## 2023-02-08 VITALS — BP 127/86 | HR 82 | Temp 96.7°F | Resp 16 | Ht 71.0 in | Wt 188.4 lb

## 2023-02-08 DIAGNOSIS — Z87891 Personal history of nicotine dependence: Secondary | ICD-10-CM | POA: Diagnosis not present

## 2023-02-08 DIAGNOSIS — C3411 Malignant neoplasm of upper lobe, right bronchus or lung: Secondary | ICD-10-CM | POA: Insufficient documentation

## 2023-02-08 NOTE — Progress Notes (Signed)
Radiation Oncology Follow up Note  Name: David Clark   Date:   02/08/2023 MRN:  [id number] DOB: 08/20/1955    This 67 y.o. male presents to the clinic today for 69-month follow-up status post SBRT treatment for stage I adenocarcinoma of the right upper lobe.  REFERRING PROVIDER: Danella Penton, MD  HPI: The patient, a 67 year old with a history of stage one adenocarcinoma in the right upper lobe, presents for a follow-up visit four months post stereotactic body radiation therapy (SBRT). He had a PET scan in early October showing no substantial change in the mixed attenuation right upper lobe lesion of concern, which was minimally smaller and showed highly decreased hypermetabolic activity. There was interval resolution of two intermediate subcentimeter right lung nodules, and no evidence of hypermetabolic mediastinal or hilar adenopathy. The patient has had COVID-19 three times in the past year, which may be related to some of the findings. He has a follow-up CT scan scheduled for early January.  The patient reports feeling okay overall, with no significant respiratory symptoms. He continues to use an inhaler and reports occasional production of phlegm, but no significant cough. Swallowing is reported as normal..  COMPLICATIONS OF TREATMENT: none  FOLLOW UP COMPLIANCE: keeps appointments   PHYSICAL EXAM:  BP 127/86   Pulse 82   Temp (!) 96.7 F (35.9 C)   Resp 16   Ht 5\' 11"  (1.803 m)   Wt 188 lb 6.4 oz (85.5 kg)   BMI 26.28 kg/m  Well-developed well-nourished patient in NAD. HEENT reveals PERLA, EOMI, discs not visualized.  Oral cavity is clear. No oral mucosal lesions are identified. Neck is clear without evidence of cervical or supraclavicular adenopathy. Lungs are clear to A&P. Cardiac examination is essentially unremarkable with regular rate and rhythm without murmur rub or thrill. Abdomen is benign with no organomegaly or masses noted. Motor sensory and DTR levels are equal and  symmetric in the upper and lower extremities. Cranial nerves II through XII are grossly intact. Proprioception is intact. No peripheral adenopathy or edema is identified. No motor or sensory levels are noted. Crude visual fields are within normal range.  RADIOLOGY RESULTS: PET CT scan reviewed compatible with above-stated findings RADIOLOGY PET scan: No substantial change in mixed attenuation right upper lobe lesion, minimally smaller, highly decreased hypermetabolic activity. Interval resolution of two intermediate subcentimeter right lung nodules. No evidence of hypermetabolic mediastinal or hilar adenopathy. (01/2023) PLAN: Stage 1 Adenocarcinoma of the Right Upper Lobe Completed SBRT 4 months ago. Recent PET scan showed no substantial change in right upper lobe lesion, which is minimally smaller and shows highly decreased hypermetabolic activity. No evidence of hypermetabolic mediastinal or hilar adenopathy. Some findings may be related to recent COVID infections. -Scheduled CT scan in early January 2025 for further follow-up. -Return for follow-up in 6 months.  COVID-19 Patient has had COVID three times in the past year. Currently asymptomatic. -Continue current management.  Use of Inhaler Patient continues to use inhaler for respiratory symptoms. -Continue current management.    Carmina Miller, MD

## 2023-02-24 DIAGNOSIS — Z125 Encounter for screening for malignant neoplasm of prostate: Secondary | ICD-10-CM | POA: Diagnosis not present

## 2023-02-24 DIAGNOSIS — E782 Mixed hyperlipidemia: Secondary | ICD-10-CM | POA: Diagnosis not present

## 2023-03-03 DIAGNOSIS — C3411 Malignant neoplasm of upper lobe, right bronchus or lung: Secondary | ICD-10-CM | POA: Diagnosis not present

## 2023-03-03 DIAGNOSIS — Z1211 Encounter for screening for malignant neoplasm of colon: Secondary | ICD-10-CM | POA: Diagnosis not present

## 2023-03-03 DIAGNOSIS — Z0000 Encounter for general adult medical examination without abnormal findings: Secondary | ICD-10-CM | POA: Diagnosis not present

## 2023-03-03 DIAGNOSIS — M4726 Other spondylosis with radiculopathy, lumbar region: Secondary | ICD-10-CM | POA: Diagnosis not present

## 2023-03-03 DIAGNOSIS — M5116 Intervertebral disc disorders with radiculopathy, lumbar region: Secondary | ICD-10-CM | POA: Diagnosis not present

## 2023-03-03 DIAGNOSIS — R972 Elevated prostate specific antigen [PSA]: Secondary | ICD-10-CM | POA: Diagnosis not present

## 2023-03-03 DIAGNOSIS — E782 Mixed hyperlipidemia: Secondary | ICD-10-CM | POA: Diagnosis not present

## 2023-03-03 DIAGNOSIS — I700 Atherosclerosis of aorta: Secondary | ICD-10-CM | POA: Diagnosis not present

## 2023-03-17 DIAGNOSIS — M5116 Intervertebral disc disorders with radiculopathy, lumbar region: Secondary | ICD-10-CM | POA: Diagnosis not present

## 2023-03-17 DIAGNOSIS — C3411 Malignant neoplasm of upper lobe, right bronchus or lung: Secondary | ICD-10-CM | POA: Diagnosis not present

## 2023-04-09 ENCOUNTER — Ambulatory Visit
Admission: RE | Admit: 2023-04-09 | Discharge: 2023-04-09 | Disposition: A | Discharge status: Home / Self Care | Payer: PPO | Source: Ambulatory Visit | Attending: Oncology | Admitting: Oncology

## 2023-04-09 DIAGNOSIS — C3411 Malignant neoplasm of upper lobe, right bronchus or lung: Secondary | ICD-10-CM | POA: Diagnosis not present

## 2023-04-09 DIAGNOSIS — J439 Emphysema, unspecified: Secondary | ICD-10-CM | POA: Diagnosis not present

## 2023-04-09 DIAGNOSIS — R161 Splenomegaly, not elsewhere classified: Secondary | ICD-10-CM | POA: Diagnosis not present

## 2023-04-09 MED ORDER — IOHEXOL 300 MG/ML  SOLN
75.0000 mL | Freq: Once | INTRAMUSCULAR | Status: AC | PRN
  Administered 2023-04-09: 75 mL via INTRAVENOUS

## 2023-04-16 ENCOUNTER — Encounter: Payer: Self-pay | Admitting: Oncology

## 2023-04-16 ENCOUNTER — Inpatient Hospital Stay: Payer: PPO | Attending: Oncology | Admitting: Oncology

## 2023-04-16 VITALS — BP 121/87 | HR 105 | Temp 96.5°F | Resp 18 | Ht 71.0 in | Wt 184.0 lb

## 2023-04-16 DIAGNOSIS — F17210 Nicotine dependence, cigarettes, uncomplicated: Secondary | ICD-10-CM | POA: Diagnosis not present

## 2023-04-16 DIAGNOSIS — C3411 Malignant neoplasm of upper lobe, right bronchus or lung: Secondary | ICD-10-CM | POA: Diagnosis not present

## 2023-04-16 NOTE — Progress Notes (Signed)
 Asking if he can get a refill on inhaler. Having chest congestion and productive cough. Noticed increased SOB as well.

## 2023-04-16 NOTE — Progress Notes (Signed)
 [facility]  Telephone:([phone] Fax:(336) [phone]  ID: David Clark Seen OB: 06-Mar-1956  MR#: [id number]  RDW#:[id number]  Patient Care Team: Cleotilde Oneil FALCON, MD as PCP - General (Internal Medicine) Verdene Gills, RN as Oncology Nurse Navigator   CHIEF COMPLAINT: Stage IA2 adenocarcinoma of the right upper lobe lung.    INTERVAL HISTORY: Patient returns to clinic today for further evaluation and discussion of his CT scan results.  He continues to have chronic cough and shortness of breath, but otherwise feels well.  He has no neurologic complaints.  He denies any recent fevers.  He denies any chest pain or hemoptysis.  He denies any nausea, vomiting, constipation, or diarrhea.  He has no urinary complaints.  Patient offers no further specific complaints today.  REVIEW OF SYSTEMS:   Review of Systems  Constitutional: Negative.  Negative for fever, malaise/fatigue and weight loss.  Respiratory:  Positive for cough and shortness of breath. Negative for hemoptysis.   Cardiovascular: Negative.  Negative for chest pain and leg swelling.  Gastrointestinal: Negative.  Negative for abdominal pain.  Genitourinary: Negative.  Negative for frequency.  Musculoskeletal: Negative.  Negative for back pain.  Skin: Negative.  Negative for rash.  Neurological: Negative.  Negative for dizziness, focal weakness, weakness and headaches.  Psychiatric/Behavioral: Negative.  The patient is not nervous/anxious.     As per HPI. Otherwise, a complete review of systems is negative.  PAST MEDICAL HISTORY: Past Medical History:  Diagnosis Date   Arthritis    BPH (benign prostatic hyperplasia)    Cervical disc disease    COPD (chronic obstructive pulmonary disease) (HCC)    COVID    GERD (gastroesophageal reflux disease)    Hepatitis B 1993   HLD (hyperlipidemia)    Hypertension    Hypomagnesemia    Hyponatremia    Pneumonia    Sepsis (HCC)    Squamous cell carcinoma, arm, left      PAST SURGICAL HISTORY: Past Surgical History:  Procedure Laterality Date   ANTERIOR CERVICAL DECOMP/DISCECTOMY FUSION N/A 01/01/2021   Procedure: C5-6 ANTERIOR CERVICAL DECOMPRESSION/DISCECTOMY FUSION 1 LEVEL;  Surgeon: Clois Fret, MD;  Location: [facility] ORS;  Service: Neurosurgery;  Laterality: N/A;   VIDEO BRONCHOSCOPY WITH ENDOBRONCHIAL ULTRASOUND N/A 07/24/2022   Procedure: VIDEO BRONCHOSCOPY WITH ENDOBRONCHIAL ULTRASOUND;  Surgeon: Parris Manna, MD;  Location: [facility] ORS;  Service: Thoracic;  Laterality: N/A;    FAMILY HISTORY: Family History  Problem Relation Age of Onset   Diabetes type II Mother    COPD Mother    Heart attack Mother    Heart attack Father    Dementia Father    Prostate cancer Father    Heart disease Sister     ADVANCED DIRECTIVES (Y/N):  N  HEALTH MAINTENANCE: Social History   Tobacco Use   Smoking status: Some Days    Current packs/day: 0.00    Types: Cigarettes, Cigars    Last attempt to quit: 1992    Years since quitting: 33.0   Smokeless tobacco: Current    Types: Snuff  Vaping Use   Vaping status: Never Used  Substance Use Topics   Alcohol use: Yes    Alcohol/week: 42.0 standard drinks of alcohol    Types: 42 Cans of beer per week    Comment: 6 pk day   Drug use: Not Currently    Types: Cocaine    Comment: IV use 1980s     Colonoscopy:  PAP:  Bone density:  Lipid panel:  No  Known Allergies  Current Outpatient Medications  Medication Sig Dispense Refill   amLODipine  (NORVASC ) 5 MG tablet Take 5 mg by mouth at bedtime.     Fluticasone-Umeclidin-Vilant (TRELEGY ELLIPTA) 100-62.5-25 MCG/ACT AEPB Inhale 1 puff into the lungs daily.     ibuprofen  (ADVIL ) 200 MG tablet Take 200 mg by mouth every 6 (six) hours as needed.     omeprazole (PRILOSEC) 20 MG capsule Take 20 mg by mouth daily.     tamsulosin  (FLOMAX ) 0.4 MG CAPS capsule Take 0.4 mg by mouth daily.     albuterol  (VENTOLIN  HFA) 108 (90 Base) MCG/ACT inhaler Inhale 2  puffs into the lungs every 6 (six) hours as needed for wheezing or shortness of breath. (Patient not taking: Reported on 11/05/2022) 18 g 0   colchicine  0.6 MG tablet Take 0.6 mg by mouth 2 (two) times daily as needed (gout flare). (Patient not taking: Reported on 11/05/2022)     megestrol  (MEGACE ) 40 MG tablet Take 1 tablet (40 mg total) by mouth daily. (Patient not taking: Reported on 04/16/2023) 30 tablet 2   traZODone (DESYREL) 50 MG tablet Take 50 mg by mouth at bedtime. (Patient not taking: Reported on 04/16/2023)     No current facility-administered medications for this visit.    OBJECTIVE: Vitals:   04/16/23 0956  BP: 121/87  Pulse: (!) 105  Resp: 18  Temp: (!) 96.5 F (35.8 C)  SpO2: 96%      Body mass index is 25.66 kg/m.    ECOG FS:0 - Asymptomatic  General: Well-developed, well-nourished, no acute distress. Eyes: Pink conjunctiva, anicteric sclera. HEENT: Normocephalic, moist mucous membranes. Lungs: No audible wheezing or coughing. Heart: Regular rate and rhythm. Abdomen: Soft, nontender, no obvious distention. Musculoskeletal: No edema, cyanosis, or clubbing. Neuro: Alert, answering all questions appropriately. Cranial nerves grossly intact. Skin: No rashes or petechiae noted. Psych: Normal affect.  LAB RESULTS:  Lab Results  Component Value Date   NA 136 01/05/2023   K 4.7 01/05/2023   CL 100 01/05/2023   CO2 26 01/05/2023   GLUCOSE 112 (H) 01/05/2023   BUN 9 01/05/2023   CREATININE 0.80 01/05/2023   CALCIUM 9.4 01/05/2023   PROT 8.1 01/05/2023   ALBUMIN 4.2 01/05/2023   AST 40 01/05/2023   ALT 31 01/05/2023   ALKPHOS 78 01/05/2023   BILITOT 1.0 01/05/2023   GFRNONAA >60 01/05/2023    Lab Results  Component Value Date   WBC 5.6 01/05/2023   NEUTROABS 3.1 01/05/2023   HGB 15.0 01/05/2023   HCT 40.0 01/05/2023   MCV 100.0 01/05/2023   PLT 199 01/05/2023     STUDIES: CT Chest W Contrast Result Date: 04/16/2023 CLINICAL DATA:  Follow up right  upper lobe lung cancer. * Tracking Code: BO * EXAM: CT CHEST WITH CONTRAST TECHNIQUE: Multidetector CT imaging of the chest was performed during intravenous contrast administration. RADIATION DOSE REDUCTION: This exam was performed according to the departmental dose-optimization program which includes automated exposure control, adjustment of the mA and/or kV according to patient size and/or use of iterative reconstruction technique. CONTRAST:  75mL OMNIPAQUE  IOHEXOL  300 MG/ML  SOLN COMPARISON:  PET-CT 12/29/2022.  Standard CT 07/21/2018 FINDINGS: Cardiovascular: Heart is nonenlarged. No pericardial effusion. Coronary artery calcifications are seen. The thoracic aorta has a normal course and caliber with partially calcified mild atherosclerotic plaque. There is also plaque extending along the origin of the great vessels with some mild areas of narrowing along the brachiocephalic and left common carotid artery. Stenosis approaches  up to 50%. There is some enlargement of the pulmonary arteries. Please correlate for any evidence of pulmonary artery hypertension Mediastinum/Nodes: Thyroid  gland is unremarkable. Normal course and caliber of the thoracic esophagus which has some luminal air. No specific abnormal lymph node enlargement identified in the axillary regions or mediastinum. A few small less than 1 cm size in short axis mediastinal nodes are seen, nonpathologic by size criteria. There are some small hilar nodes. Example on the right has short axis dimension of 10 mm on series 2, image 78. Previously 9 mm. This has not hypermetabolic on the prior PET-CT Lungs/Pleura: Advanced emphysematous changes identified there is also scattered areas of peripheral interstitial septal thickening consistent with scarring and fibrosis. No consolidation, pneumothorax or effusion. Specific linear changes identified along the middle lobe and lingula likely scar or atelectasis. There is a small left upper lobe lung nodule anteriorly  on series 4, image 52 measuring 4 mm. This has been stable since at least December 2023. The spiculated mass in the right upper lobe is again identified. This has central cavitation as well. Mass previously measured 2.6 cm in AP dimension and today 2.3 by 2.1 cm. There is also a component of nodularity just medial and superior which is new on series 4, image 57 measuring 16 by 7 mm. There is increasing adjacent interstitial ill-defined opacities extending lateral with some nodularity. Upper Abdomen: Fatty liver infiltration seen in the upper abdomen. Spleen is enlarged with an AP length of 15.2 cm. There is significant wall thickening along the collapsed stomach. Please correlate with any symptoms. Musculoskeletal: Scattered degenerative changes along the spine. IMPRESSION: Right upper lobe cavitary lesion appears slightly smaller today compared to the previous examination. However there is a new nodular focus just medial to this measuring 16 x 7 mm. There is also increasing peripheral reticular and nodular changes. Additional workup or short follow up. No developing pleural effusion.  No new lymph node enlargement. Fatty liver infiltration.  Splenomegaly. Wall thickening of the stomach. Please correlate with any symptoms further workup when appropriate. Aortic Atherosclerosis (ICD10-I70.0) and Emphysema (ICD10-J43.9). Electronically Signed   By: Ranell Bring M.D.   On: 04/16/2023 10:03    ASSESSMENT: Stage IA2 adenocarcinoma of the right upper lobe lung.  PLAN:    Stage IA2 adenocarcinoma of the right upper lobe lung: Diagnosis confirmed by bronchoscopy on July 24, 2022.  PET scan results from July 08, 2022 reviewed independently with no obvious evidence of disease outside his known pulmonary nodule.  It was determined patient was not a surgical candidate and he proceeded directly with XRT completing in June 2024.  His most recent imaging with CT scan on April 16, 2023 for reviewed independently and  report as above with resolution of his known pulmonary nodule, but patient now has a new nodular focus just medial to this lesion measuring 1.6 cm.  Will repeat CT scan in 3 months and if no improvement or resolution, will consider PET scan and bronchoscopy at that point.  Return to clinic 1 week after his imaging to discuss the results.   Shortness of breath/cough: Patient has been recommended to follow-up with his primary pulmonologist.     I spent a total of 20 minutes reviewing chart data, face-to-face evaluation with the patient, counseling and coordination of care as detailed above.   Patient expressed understanding and was in agreement with this plan. He also understands that He can call clinic at any time with any questions, concerns, or complaints.  Cancer Staging  Adenocarcinoma, lung, right [facility]) Staging form: Lung, AJCC 8th Edition - Clinical stage from 07/30/2022: Stage IA2 (cT1b, cN0, cM0) - Signed by [name] Evalene PARAS, MD on 07/30/2022 Stage prefix: Initial diagnosis   Evalene PARAS [name], MD   04/16/2023 12:38 PM

## 2023-04-30 DIAGNOSIS — J188 Other pneumonia, unspecified organism: Secondary | ICD-10-CM | POA: Diagnosis not present

## 2023-06-21 ENCOUNTER — Ambulatory Visit
Admission: RE | Admit: 2023-06-21 | Discharge: 2023-06-21 | Disposition: A | Discharge status: Home / Self Care | Source: Ambulatory Visit | Attending: Internal Medicine | Admitting: Internal Medicine

## 2023-06-21 ENCOUNTER — Other Ambulatory Visit: Payer: Self-pay | Admitting: Internal Medicine

## 2023-06-21 DIAGNOSIS — M4726 Other spondylosis with radiculopathy, lumbar region: Secondary | ICD-10-CM | POA: Diagnosis not present

## 2023-06-21 DIAGNOSIS — G959 Disease of spinal cord, unspecified: Secondary | ICD-10-CM | POA: Insufficient documentation

## 2023-06-21 DIAGNOSIS — M4727 Other spondylosis with radiculopathy, lumbosacral region: Secondary | ICD-10-CM | POA: Diagnosis not present

## 2023-06-21 DIAGNOSIS — C3411 Malignant neoplasm of upper lobe, right bronchus or lung: Secondary | ICD-10-CM | POA: Diagnosis not present

## 2023-06-21 DIAGNOSIS — M5117 Intervertebral disc disorders with radiculopathy, lumbosacral region: Secondary | ICD-10-CM | POA: Diagnosis not present

## 2023-06-21 DIAGNOSIS — J449 Chronic obstructive pulmonary disease, unspecified: Secondary | ICD-10-CM | POA: Diagnosis not present

## 2023-06-21 DIAGNOSIS — M5116 Intervertebral disc disorders with radiculopathy, lumbar region: Secondary | ICD-10-CM | POA: Diagnosis not present

## 2023-06-25 DIAGNOSIS — M5416 Radiculopathy, lumbar region: Secondary | ICD-10-CM | POA: Diagnosis not present

## 2023-06-25 DIAGNOSIS — M48062 Spinal stenosis, lumbar region with neurogenic claudication: Secondary | ICD-10-CM | POA: Diagnosis not present

## 2023-07-01 ENCOUNTER — Telehealth: Payer: Self-pay | Admitting: *Deleted

## 2023-07-01 NOTE — Telephone Encounter (Signed)
 They need to have the clearance done and send back soon his procedure is  on 4/1, please fax it back filled out or call and tell them why.

## 2023-07-06 DIAGNOSIS — M5416 Radiculopathy, lumbar region: Secondary | ICD-10-CM | POA: Diagnosis not present

## 2023-07-06 DIAGNOSIS — M48062 Spinal stenosis, lumbar region with neurogenic claudication: Secondary | ICD-10-CM | POA: Diagnosis not present

## 2023-07-16 ENCOUNTER — Ambulatory Visit
Admission: RE | Admit: 2023-07-16 | Discharge: 2023-07-16 | Disposition: A | Discharge status: Home / Self Care | Payer: PPO | Source: Ambulatory Visit | Attending: Oncology | Admitting: Oncology

## 2023-07-16 DIAGNOSIS — I700 Atherosclerosis of aorta: Secondary | ICD-10-CM | POA: Diagnosis not present

## 2023-07-16 DIAGNOSIS — J432 Centrilobular emphysema: Secondary | ICD-10-CM | POA: Diagnosis not present

## 2023-07-16 DIAGNOSIS — C3411 Malignant neoplasm of upper lobe, right bronchus or lung: Secondary | ICD-10-CM

## 2023-07-16 DIAGNOSIS — C3490 Malignant neoplasm of unspecified part of unspecified bronchus or lung: Secondary | ICD-10-CM | POA: Diagnosis not present

## 2023-07-16 MED ORDER — IOHEXOL 300 MG/ML  SOLN
75.0000 mL | Freq: Once | INTRAMUSCULAR | Status: AC | PRN
  Administered 2023-07-16: 75 mL via INTRAVENOUS

## 2023-07-19 DIAGNOSIS — T17500A Unspecified foreign body in bronchus causing asphyxiation, initial encounter: Secondary | ICD-10-CM | POA: Diagnosis not present

## 2023-07-19 DIAGNOSIS — C3411 Malignant neoplasm of upper lobe, right bronchus or lung: Secondary | ICD-10-CM | POA: Diagnosis not present

## 2023-07-19 DIAGNOSIS — R053 Chronic cough: Secondary | ICD-10-CM | POA: Diagnosis not present

## 2023-07-19 DIAGNOSIS — J449 Chronic obstructive pulmonary disease, unspecified: Secondary | ICD-10-CM | POA: Diagnosis not present

## 2023-07-23 ENCOUNTER — Inpatient Hospital Stay: Payer: PPO | Attending: Oncology | Admitting: Oncology

## 2023-07-23 ENCOUNTER — Encounter: Payer: Self-pay | Admitting: Oncology

## 2023-07-23 VITALS — BP 137/84 | HR 107 | Temp 96.1°F | Resp 16 | Ht 71.0 in | Wt 187.8 lb

## 2023-07-23 DIAGNOSIS — C3411 Malignant neoplasm of upper lobe, right bronchus or lung: Secondary | ICD-10-CM | POA: Insufficient documentation

## 2023-07-23 DIAGNOSIS — C3491 Malignant neoplasm of unspecified part of right bronchus or lung: Secondary | ICD-10-CM | POA: Diagnosis not present

## 2023-07-23 DIAGNOSIS — F17210 Nicotine dependence, cigarettes, uncomplicated: Secondary | ICD-10-CM | POA: Diagnosis not present

## 2023-07-23 NOTE — Progress Notes (Signed)
 [facility]  Telephone:(336) [phone] Fax:(336) [phone]  ID: David Clark OB: 05-03-1955  MR#: [id number]  AOZ#:[id number]  Patient Care Team: Sari Cunning, MD as PCP - General (Internal Medicine) Drake Gens, RN as Oncology Nurse Navigator Adrian Alba, Deadra Everts, MD as Consulting Physician (Oncology)   CHIEF COMPLAINT: Stage IA2 adenocarcinoma of the right upper lobe lung.    INTERVAL HISTORY: Patient returns to clinic today for routine 41-month evaluation and discussion of his imaging results.  He has chronic back pain.  He also continues to have chronic cough and shortness of breath.  He otherwise feels well. He has no neurologic complaints.  He denies any recent fevers.  He denies any chest pain or hemoptysis.  He denies any nausea, vomiting, constipation, or diarrhea.  He has no urinary complaints.  Patient offers no further specific complaints today.  REVIEW OF SYSTEMS:   Review of Systems  Constitutional: Negative.  Negative for fever, malaise/fatigue and weight loss.  Respiratory:  Positive for cough and shortness of breath. Negative for hemoptysis.   Cardiovascular: Negative.  Negative for chest pain and leg swelling.  Gastrointestinal: Negative.  Negative for abdominal pain.  Genitourinary: Negative.  Negative for frequency.  Musculoskeletal:  Positive for back pain.  Skin: Negative.  Negative for rash.  Neurological: Negative.  Negative for dizziness, focal weakness, weakness and headaches.  Psychiatric/Behavioral: Negative.  The patient is not nervous/anxious.     As per HPI. Otherwise, a complete review of systems is negative.  PAST MEDICAL HISTORY: Past Medical History:  Diagnosis Date   Arthritis    BPH (benign prostatic hyperplasia)    Cervical disc disease    COPD (chronic obstructive pulmonary disease) (HCC)    COVID    GERD (gastroesophageal reflux disease)    Hepatitis B 1993   HLD (hyperlipidemia)    Hypertension    Hypomagnesemia     Hyponatremia    Pneumonia    Sepsis (HCC)    Squamous cell carcinoma, arm, left     PAST SURGICAL HISTORY: Past Surgical History:  Procedure Laterality Date   ANTERIOR CERVICAL DECOMP/DISCECTOMY FUSION N/A 01/01/2021   Procedure: C5-6 ANTERIOR CERVICAL DECOMPRESSION/DISCECTOMY FUSION 1 LEVEL;  Surgeon: Jodeen Munch, MD;  Location: [facility] ORS;  Service: Neurosurgery;  Laterality: N/A;   VIDEO BRONCHOSCOPY WITH ENDOBRONCHIAL ULTRASOUND N/A 07/24/2022   Procedure: VIDEO BRONCHOSCOPY WITH ENDOBRONCHIAL ULTRASOUND;  Surgeon: Erskin Hearing, MD;  Location: [facility] ORS;  Service: Thoracic;  Laterality: N/A;    FAMILY HISTORY: Family History  Problem Relation Age of Onset   Diabetes type II Mother    COPD Mother    Heart attack Mother    Heart attack Father    Dementia Father    Prostate cancer Father    Heart disease Sister     ADVANCED DIRECTIVES (Y/N):  N  HEALTH MAINTENANCE: Social History   Tobacco Use   Smoking status: Some Days    Current packs/day: 0.00    Types: Cigarettes, Cigars    Last attempt to quit: 1992    Years since quitting: 33.3   Smokeless tobacco: Current    Types: Snuff  Vaping Use   Vaping status: Never Used  Substance Use Topics   Alcohol use: Yes    Alcohol/week: 42.0 standard drinks of alcohol    Types: 42 Cans of beer per week    Comment: 6 pk day   Drug use: Not Currently    Types: Cocaine    Comment: IV use 1980"s  Colonoscopy:  PAP:  Bone density:  Lipid panel:  No Known Allergies  Current Outpatient Medications  Medication Sig Dispense Refill   amLODipine  (NORVASC ) 5 MG tablet Take 5 mg by mouth at bedtime.     Fluticasone-Umeclidin-Vilant (TRELEGY ELLIPTA) 100-62.5-25 MCG/ACT AEPB Inhale 1 puff into the lungs daily.     ibuprofen  (ADVIL ) 200 MG tablet Take 200 mg by mouth every 6 (six) hours as needed.     omeprazole (PRILOSEC) 20 MG capsule Take 20 mg by mouth daily.     predniSONE  (DELTASONE ) 2.5 MG tablet Take 2.5 mg  by mouth daily with breakfast.     sulfamethoxazole-trimethoprim (BACTRIM) 400-80 MG tablet Take 1 tablet by mouth 3 (three) times a week.     tamsulosin  (FLOMAX ) 0.4 MG CAPS capsule Take 0.4 mg by mouth daily.     albuterol  (VENTOLIN  HFA) 108 (90 Base) MCG/ACT inhaler Inhale 2 puffs into the lungs every 6 (six) hours as needed for wheezing or shortness of breath. (Patient not taking: Reported on 11/05/2022) 18 g 0   colchicine  0.6 MG tablet Take 0.6 mg by mouth 2 (two) times daily as needed (gout flare). (Patient not taking: Reported on 11/05/2022)     megestrol  (MEGACE ) 40 MG tablet Take 1 tablet (40 mg total) by mouth daily. (Patient not taking: Reported on 11/05/2022) 30 tablet 2   traZODone (DESYREL) 50 MG tablet Take 50 mg by mouth at bedtime. (Patient not taking: Reported on 11/05/2022)     No current facility-administered medications for this visit.    OBJECTIVE: Vitals:   07/23/23 1024  BP: 137/84  Pulse: (!) 107  Resp: 16  Temp: (!) 96.1 F (35.6 C)  SpO2: 98%      Body mass index is 26.19 kg/m.    ECOG FS:0 - Asymptomatic  General: Well-developed, well-nourished, no acute distress. Eyes: Pink conjunctiva, anicteric sclera. HEENT: Normocephalic, moist mucous membranes. Lungs: No audible wheezing or coughing. Heart: Regular rate and rhythm. Abdomen: Soft, nontender, no obvious distention. Musculoskeletal: No edema, cyanosis, or clubbing. Neuro: Alert, answering all questions appropriately. Cranial nerves grossly intact. Skin: No rashes or petechiae noted. Psych: Normal affect.  LAB RESULTS:  Lab Results  Component Value Date   NA 136 01/05/2023   K 4.7 01/05/2023   CL 100 01/05/2023   CO2 26 01/05/2023   GLUCOSE 112 (H) 01/05/2023   BUN 9 01/05/2023   CREATININE 0.80 01/05/2023   CALCIUM 9.4 01/05/2023   PROT 8.1 01/05/2023   ALBUMIN 4.2 01/05/2023   AST 40 01/05/2023   ALT 31 01/05/2023   ALKPHOS 78 01/05/2023   BILITOT 1.0 01/05/2023   GFRNONAA >60 01/05/2023     Lab Results  Component Value Date   WBC 5.6 01/05/2023   NEUTROABS 3.1 01/05/2023   HGB 15.0 01/05/2023   HCT 40.0 01/05/2023   MCV 100.0 01/05/2023   PLT 199 01/05/2023     STUDIES: CT CHEST W CONTRAST Result Date: 07/18/2023 CLINICAL DATA:  Lung cancer.  Restaging.  * Tracking Code: BO * EXAM: CT CHEST WITH CONTRAST TECHNIQUE: Multidetector CT imaging of the chest was performed during intravenous contrast administration. RADIATION DOSE REDUCTION: This exam was performed according to the departmental dose-optimization program which includes automated exposure control, adjustment of the mA and/or kV according to patient size and/or use of iterative reconstruction technique. CONTRAST:  75mL OMNIPAQUE  IOHEXOL  300 MG/ML  SOLN COMPARISON:  04/09/2023. FINDINGS: Cardiovascular: The heart size is normal. No substantial pericardial effusion. Coronary artery calcification is evident.  Mild atherosclerotic calcification is noted in the wall of the thoracic aorta. Enlargement of the pulmonary outflow tract/main pulmonary arteries suggests pulmonary arterial hypertension. Mediastinum/Nodes: No mediastinal lymphadenopathy. There is no hilar lymphadenopathy. The esophagus has normal imaging features. There is no axillary lymphadenopathy. Lungs/Pleura: Centrilobular and paraseptal emphysema evident. Right upper lobe cavitary lesion has become more confluent in the interval measuring 2.2 x 1.7 cm today compared to 2.3 x 2.1 cm previously. A more central nodular component measured previously at 1.6 x 0.7 cm has a more globular configuration on today's study measuring 1.1 x 1.0 cm. Adjacent interstitial and airspace opacity is similar to prior and likely treatment related although postobstructive process could have this appearance. 4 mm anterior left upper lobe nodule on 48/3 is stable. No new suspicious pulmonary nodule or mass. No focal airspace consolidation. There is no evidence of pleural effusion. Upper  Abdomen: The liver shows diffusely decreased attenuation suggesting fat deposition. No adrenal nodule or mass. Musculoskeletal: No worrisome lytic or sclerotic osseous abnormality. IMPRESSION: 1. Right upper lobe cavitary lesion has become more confluent in the interval measuring smaller at 2.2 x 1.7 cm today compared to 2.3 x 2.1 cm previously. A more central nodular component measured previously at 1.6 x 0.7 cm has a more globular configuration on today's study measuring 1.1 x 1.0 cm. Close attention on follow-up recommended. 2. Stable 4 mm anterior left upper lobe nodule. 3. Enlargement of the pulmonary outflow tract/main pulmonary arteries suggests pulmonary arterial hypertension. 4. Hepatic steatosis. Electronically Signed   By: Donnal Fusi M.D.   On: 07/18/2023 09:09    ASSESSMENT: Stage IA2 adenocarcinoma of the right upper lobe lung.  PLAN:    Stage IA2 adenocarcinoma of the right upper lobe lung: Diagnosis confirmed by bronchoscopy on July 24, 2022.  PET scan results from July 08, 2022 reviewed independently with no obvious evidence of disease outside his known pulmonary nodule.  It was determined patient was not a surgical candidate and he proceeded directly with XRT completing in June 2024.  His most recent CT scan on July 18, 2023 reviewed independently and reported as above with more:, But smaller right upper lobe cavitary lesion.  The more central nodular component has also decreased in size, but still evident.  Will discuss patient at tumor board to assess whether PET scan is necessary or just continued monitoring with CT scan.  Return to clinic in 3 months with repeat imaging and further evaluation.   Shortness of breath/cough: Secondary to underlying COPD.  Appreciate pulmonary input.  Continue follow-up with them as indicated. Back pain: Unrelated to malignancy.  Patient reports he may ask his primary care physician for a referral to his wife's orthopedic and [facility].   Patient  expressed understanding and was in agreement with this plan. He also understands that He can call clinic at any time with any questions, concerns, or complaints.    Cancer Staging  Adenocarcinoma, lung, right [facility]) Staging form: Lung, AJCC 8th Edition - Clinical stage from 07/30/2022: Stage IA2 (cT1b, cN0, cM0) - Signed by Shellie Dials, MD on 07/30/2022 Stage prefix: Initial diagnosis   Shellie Dials, MD   07/23/2023 12:17 PM

## 2023-07-29 ENCOUNTER — Other Ambulatory Visit

## 2023-07-30 ENCOUNTER — Telehealth: Payer: Self-pay | Admitting: *Deleted

## 2023-07-30 NOTE — Telephone Encounter (Signed)
 RN placed call back to patient to let him know that Dr. Jaclynn Mast will waive the co-pay for his follow up visit. Patient is agreeable. Referral/Appointment request faxed to Dr. Wardell Guys office.

## 2023-07-30 NOTE — Telephone Encounter (Signed)
 I called patient to discuss tumor board recommendations, repeat CT imaging and 3 months and evaluation by Dr. Aleskerov. Patient does not wish to schedule appointment at this time due to financial situation. He said he is on a limited income and has paid out significant amount for co-pays the last few weeks. I told him we will keep CT and follow up as scheduled for now and if things change to reach out and we can facilitate follow up with Dr. Aleskerov. Patient verbalized understanding and will reach out with any questions or concerns.

## 2023-08-09 DIAGNOSIS — M5416 Radiculopathy, lumbar region: Secondary | ICD-10-CM | POA: Diagnosis not present

## 2023-08-09 DIAGNOSIS — M5442 Lumbago with sciatica, left side: Secondary | ICD-10-CM | POA: Diagnosis not present

## 2023-08-09 DIAGNOSIS — M5441 Lumbago with sciatica, right side: Secondary | ICD-10-CM | POA: Diagnosis not present

## 2023-08-09 DIAGNOSIS — G8929 Other chronic pain: Secondary | ICD-10-CM | POA: Diagnosis not present

## 2023-08-12 ENCOUNTER — Ambulatory Visit: Payer: PPO | Admitting: Radiation Oncology

## 2023-08-16 DIAGNOSIS — M5416 Radiculopathy, lumbar region: Secondary | ICD-10-CM | POA: Diagnosis not present

## 2023-09-01 DIAGNOSIS — C3411 Malignant neoplasm of upper lobe, right bronchus or lung: Secondary | ICD-10-CM | POA: Diagnosis not present

## 2023-09-01 DIAGNOSIS — R972 Elevated prostate specific antigen [PSA]: Secondary | ICD-10-CM | POA: Diagnosis not present

## 2023-09-01 DIAGNOSIS — E782 Mixed hyperlipidemia: Secondary | ICD-10-CM | POA: Diagnosis not present

## 2023-09-02 DIAGNOSIS — M5441 Lumbago with sciatica, right side: Secondary | ICD-10-CM | POA: Diagnosis not present

## 2023-09-02 DIAGNOSIS — M5442 Lumbago with sciatica, left side: Secondary | ICD-10-CM | POA: Diagnosis not present

## 2023-09-02 DIAGNOSIS — G8929 Other chronic pain: Secondary | ICD-10-CM | POA: Diagnosis not present

## 2023-09-02 DIAGNOSIS — M5416 Radiculopathy, lumbar region: Secondary | ICD-10-CM | POA: Diagnosis not present

## 2023-09-06 DIAGNOSIS — G959 Disease of spinal cord, unspecified: Secondary | ICD-10-CM | POA: Diagnosis not present

## 2023-09-06 DIAGNOSIS — C3411 Malignant neoplasm of upper lobe, right bronchus or lung: Secondary | ICD-10-CM | POA: Diagnosis not present

## 2023-09-06 DIAGNOSIS — M519 Unspecified thoracic, thoracolumbar and lumbosacral intervertebral disc disorder: Secondary | ICD-10-CM | POA: Diagnosis not present

## 2023-09-07 NOTE — Progress Notes (Unsigned)
 Referring Physician:  Jolie Neat, MD [address] [place],  [place] [address]  Primary Physician:  Sari Cunning, MD  History of Present Illness: 09/08/2023 David Clark is here today with a chief complaint of lumbar stenosis with progressive lumbar claudication.  He has had low back and hip pain for over the past 6 months.  This is because progressive worsening in his ambulation.  He has difficulty walking for any considerable amount of time.  Feels that he needs to lean over onto objects to help with his symptoms.  When he sits down and bends forward this does help him recover.  He cannot recover if he stays standing.  He is also had some urinary urgency as well.  He has been working with physical medicine and rehabilitation and has had multiple injections which only helped for a very short period of time.  He states that his pain is often 10 out of 10 and is been intractable and worsening over the past few months.  He feels that his legs are going out on him.  He has had a previous ACDF with my partner Dr. Jeris Montes.  Conservative measures:  Physical therapy: has an initial consult on 09/28/2023   Multimodal medical therapy including regular antiinflammatories: Ibuprofen , Prednisone , tramadol, hydrocodone  Injections: 08/16/2023 Bilateral L5-S1 ESI - helped for that day 07/06/2023 S1 TFESI - did not help at all  Past Surgery: 01/01/2021 C5-6 ACDF Surgeon: Jodeen Munch, MD  The symptoms are causing a significant impact on the patient's life.   I have utilized the care everywhere function in epic to review the outside records available from external health systems.  Review of Systems:  A 10 point review of systems is negative, except for the pertinent positives and negatives detailed in the HPI.  Past Medical History: Past Medical History:  Diagnosis Date   Adenocarcinoma of upper lobe of right lung (HCC)    Arthritis    BPH (benign prostatic hyperplasia)     Cervical disc disease    COPD (chronic obstructive pulmonary disease) (HCC)    COVID    GERD (gastroesophageal reflux disease)    Gout    Hepatitis B 1993   HLD (hyperlipidemia)    Hypertension    Hypomagnesemia    Hyponatremia    Pneumonia    Sepsis (HCC)    Squamous cell carcinoma, arm, left     Past Surgical History: Past Surgical History:  Procedure Laterality Date   ANTERIOR CERVICAL DECOMP/DISCECTOMY FUSION N/A 01/01/2021   Procedure: C5-6 ANTERIOR CERVICAL DECOMPRESSION/DISCECTOMY FUSION 1 LEVEL;  Surgeon: Jodeen Munch, MD;  Location: [facility] ORS;  Service: Neurosurgery;  Laterality: N/A;   Excision skin lesion- skin cancer     VIDEO BRONCHOSCOPY WITH ENDOBRONCHIAL ULTRASOUND N/A 07/24/2022   Procedure: VIDEO BRONCHOSCOPY WITH ENDOBRONCHIAL ULTRASOUND;  Surgeon: Erskin Hearing, MD;  Location: [facility] ORS;  Service: Thoracic;  Laterality: N/A;    Allergies: Allergies as of 09/08/2023   (No Known Allergies)    Medications:  Current Outpatient Medications:    HYDROcodone-acetaminophen  (NORCO/VICODIN) 5-325 MG tablet, Take 1 tablet by mouth in the morning, at noon, and at bedtime., Disp: , Rfl:    amLODipine  (NORVASC ) 5 MG tablet, Take 5 mg by mouth at bedtime., Disp: , Rfl:    Fluticasone-Umeclidin-Vilant (TRELEGY ELLIPTA) 100-62.5-25 MCG/ACT AEPB, Inhale 1 puff into the lungs daily., Disp: , Rfl:    ibuprofen  (ADVIL ) 200 MG tablet, Take 200 mg by mouth every 6 (six) hours as  needed., Disp: , Rfl:    omeprazole (PRILOSEC) 20 MG capsule, Take 20 mg by mouth daily., Disp: , Rfl:   Social History: Social History   Tobacco Use   Smoking status: Some Days    Types: Cigars   Smokeless tobacco: Current    Types: Snuff  Vaping Use   Vaping status: Never Used  Substance Use Topics   Alcohol use: Yes    Alcohol/week: 42.0 standard drinks of alcohol    Types: 42 Cans of beer per week    Comment: 6 pk day   Drug use: Not Currently    Types: Cocaine    Comment: IV use  1980"s    Family Medical History: Family History  Problem Relation Age of Onset   Diabetes type II Mother    COPD Mother    Heart attack Mother    Heart attack Father    Dementia Father    Prostate cancer Father    Heart disease Sister     Physical Examination: Vitals:   09/08/23 1021  BP: 110/82    General: Patient is in no apparent distress. Attention to examination is appropriate.  Neck:   Supple.  Full range of motion.  Respiratory: Patient is breathing without any difficulty.   NEUROLOGICAL:     Awake, alert, oriented to person, place, and time.  Speech is clear and fluent.   Cranial Nerves: Pupils equal round and reactive to light.  Facial tone is symmetric.  Facial sensation is symmetric. Shoulder shrug is symmetric. Tongue protrusion is midline.    Strength:  Side Iliopsoas Quads Hamstring PF DF EHL  R 5 5 5 5  4+ 4+  L 5 5 5 5  4+ 4+   Reflexes are decreased specifically in the right lower extremity at the bilateral Achilles and the right medial hamstring\     No evidence of dysmetria noted.  Antalgic with forward carriage  Imaging: Narrative & Impression  CLINICAL DATA:  Low back pain with bilateral radiculopathy   EXAM: MRI LUMBAR SPINE WITHOUT CONTRAST   TECHNIQUE: Multiplanar, multisequence MR imaging of the lumbar spine was performed. No intravenous contrast was administered.   COMPARISON:  None Available.   FINDINGS: Segmentation: Presumed standard anatomy with the inferior-most well developed disc space designated as L5-S1.   Alignment:  Trace retrolisthesis at L2-3 and L3-4.   Vertebrae:  No fracture, evidence of discitis, or bone lesion.   Conus medullaris and cauda equina: Conus extends to the L1 level. Conus and cauda equina appear normal. Prominence of the epidural fat below the L3-4 disc level.   Paraspinal and other soft tissues: Negative.   Disc levels:   T12-L1 and L1-L2: Unremarkable.   L2-L3: Annular disc bulge and  mild bilateral facet hypertrophy. Prominence of the posterior epidural fat. No significant canal stenosis. Borderline-mild foraminal stenosis on the right.   L3-L4: Annular disc bulge and mild bilateral facet arthropathy. Prominence of the posterior epidural fat. Borderline-mild canal stenosis and mild bilateral foraminal stenosis.   L4-L5: Annular disc bulge and endplate spurring. Mild-to-moderate bilateral facet arthropathy. Prominence of the epidural fat. Moderate-severe canal stenosis with moderate bilateral foraminal stenosis.   L5-S1: Annular disc bulge and mild-moderate bilateral facet arthropathy. Prominence of the epidural fat compresses the thecal sac. Mild bilateral foraminal stenosis, right greater than left.   IMPRESSION: 1. Multilevel lumbar spondylosis, most pronounced at L4-5 where there is moderate-severe canal stenosis and moderate bilateral foraminal stenosis. 2. Prominence of the epidural fat contributes to thecal sac  narrowing at the L4-L5 and L5-S1 levels.     Electronically Signed   By: Leverne Reading D.O.   On: 06/21/2023 11:07   I reviewed his lumbar spine x-rays, no evidence of a mobile spondylolisthesis.  Awaiting final read.  I have personally reviewed the images and agree with the above interpretation.  Medical Decision Making/Assessment and Plan: David Clark is a pleasant 68 y.o. male with severe lumbar stenosis at L4-5 causing significant debilitating lumbar claudication symptoms.  He is had these worsening since December 2024 and has been under the care of physical medicine and rehabilitation team and has obtained multiple injections.  He has not had any significant improvement and instead feels that his ambulation is getting worse continually.  He needs to lean forward onto a support to be able to get more distance out of his ambulation, if he is not able to he has to sit down and lean forward to recover.  On physical exam he has mild weakness noted  in his dorsiflexion and EHL function, he has decreased reflexes at the right sided Achilles and medial hamstring and left-sided Achilles.  He does have some urinary urgency but no perineal sensation loss.  His imaging shows severe stenosis at L4-5 secondary to facet arthropathy and annular disc bulging.  Flexion-extension x-rays show no evidence of instability.  At this point the patient has had conservative management of his lumbar stenosis with severe claudication symptoms and continues to worsen progressively. He has had continued injections, without significant improvement. He was referred to us  by the physical medicine and rehabiltion team for surgical evaluation of lumbar claudication secondary to lumbar stenosis. Given his progression and failure of 6 months of conservative care and injections, we have discussed L4-5 lumbar laminectomy for decompression of his neural elements. We discussed risks and benefits of surgery including but not limited to CSF leak, nerve injury and need for further surgery seconday to destabilization.   Thank you for involving me in the care of this patient.    Carroll Clamp MD/MSCR Neurosurgery

## 2023-09-07 NOTE — H&P (View-Only) (Signed)
 Referring Physician:  Jolie Neat, MD [address] [place],  [place] [address]  Primary Physician:  Sari Cunning, MD  History of Present Illness: 09/08/2023 [name] is here today with a chief complaint of lumbar stenosis with progressive lumbar claudication.  He has had low back and hip pain for over the past 6 months.  This is because progressive worsening in his ambulation.  He has difficulty walking for any considerable amount of time.  Feels that he needs to lean over onto objects to help with his symptoms.  When he sits down and bends forward this does help him recover.  He cannot recover if he stays standing.  He is also had some urinary urgency as well.  He has been working with physical medicine and rehabilitation and has had multiple injections which only helped for a very short period of time.  He states that his pain is often 10 out of 10 and is been intractable and worsening over the past few months.  He feels that his legs are going out on him.  He has had a previous ACDF with my partner Dr. Jeris Montes.  Conservative measures:  Physical therapy: has an initial consult on 09/28/2023   Multimodal medical therapy including regular antiinflammatories: Ibuprofen , Prednisone , tramadol, hydrocodone  Injections: 08/16/2023 Bilateral L5-S1 ESI - helped for that day 07/06/2023 S1 TFESI - did not help at all  Past Surgery: 01/01/2021 C5-6 ACDF Surgeon: Jodeen Munch, MD  The symptoms are causing a significant impact on the patient's life.   I have utilized the care everywhere function in epic to review the outside records available from external health systems.  Review of Systems:  A 10 point review of systems is negative, except for the pertinent positives and negatives detailed in the HPI.  Past Medical History: Past Medical History:  Diagnosis Date   Adenocarcinoma of upper lobe of right lung (HCC)    Arthritis    BPH (benign prostatic hyperplasia)     Cervical disc disease    COPD (chronic obstructive pulmonary disease) (HCC)    COVID    GERD (gastroesophageal reflux disease)    Gout    Hepatitis B 1993   HLD (hyperlipidemia)    Hypertension    Hypomagnesemia    Hyponatremia    Pneumonia    Sepsis (HCC)    Squamous cell carcinoma, arm, left     Past Surgical History: Past Surgical History:  Procedure Laterality Date   ANTERIOR CERVICAL DECOMP/DISCECTOMY FUSION N/A 01/01/2021   Procedure: C5-6 ANTERIOR CERVICAL DECOMPRESSION/DISCECTOMY FUSION 1 LEVEL;  Surgeon: Jodeen Munch, MD;  Location: [facility] ORS;  Service: Neurosurgery;  Laterality: N/A;   Excision skin lesion- skin cancer     VIDEO BRONCHOSCOPY WITH ENDOBRONCHIAL ULTRASOUND N/A 07/24/2022   Procedure: VIDEO BRONCHOSCOPY WITH ENDOBRONCHIAL ULTRASOUND;  Surgeon: Erskin Hearing, MD;  Location: [facility] ORS;  Service: Thoracic;  Laterality: N/A;    Allergies: Allergies as of 09/08/2023   (No Known Allergies)    Medications:  Current Outpatient Medications:    HYDROcodone-acetaminophen  (NORCO/VICODIN) 5-325 MG tablet, Take 1 tablet by mouth in the morning, at noon, and at bedtime., Disp: , Rfl:    amLODipine  (NORVASC ) 5 MG tablet, Take 5 mg by mouth at bedtime., Disp: , Rfl:    Fluticasone-Umeclidin-Vilant (TRELEGY ELLIPTA) 100-62.5-25 MCG/ACT AEPB, Inhale 1 puff into the lungs daily., Disp: , Rfl:    ibuprofen  (ADVIL ) 200 MG tablet, Take 200 mg by mouth every 6 (six) hours as  needed., Disp: , Rfl:    omeprazole (PRILOSEC) 20 MG capsule, Take 20 mg by mouth daily., Disp: , Rfl:   Social History: Social History   Tobacco Use   Smoking status: Some Days    Types: Cigars   Smokeless tobacco: Current    Types: Snuff  Vaping Use   Vaping status: Never Used  Substance Use Topics   Alcohol use: Yes    Alcohol/week: 42.0 standard drinks of alcohol    Types: 42 Cans of beer per week    Comment: 6 pk day   Drug use: Not Currently    Types: Cocaine    Comment: IV use  1980"s    Family Medical History: Family History  Problem Relation Age of Onset   Diabetes type II Mother    COPD Mother    Heart attack Mother    Heart attack Father    Dementia Father    Prostate cancer Father    Heart disease Sister     Physical Examination: Vitals:   09/08/23 1021  BP: 110/82    General: Patient is in no apparent distress. Attention to examination is appropriate.  Neck:   Supple.  Full range of motion.  Respiratory: Patient is breathing without any difficulty.   NEUROLOGICAL:     Awake, alert, oriented to person, place, and time.  Speech is clear and fluent.   Cranial Nerves: Pupils equal round and reactive to light.  Facial tone is symmetric.  Facial sensation is symmetric. Shoulder shrug is symmetric. Tongue protrusion is midline.    Strength:  Side Iliopsoas Quads Hamstring PF DF EHL  R 5 5 5 5  4+ 4+  L 5 5 5 5  4+ 4+   Reflexes are decreased specifically in the right lower extremity at the bilateral Achilles and the right medial hamstring\     No evidence of dysmetria noted.  Antalgic with forward carriage  Imaging: Narrative & Impression  CLINICAL DATA:  Low back pain with bilateral radiculopathy   EXAM: MRI LUMBAR SPINE WITHOUT CONTRAST   TECHNIQUE: Multiplanar, multisequence MR imaging of the lumbar spine was performed. No intravenous contrast was administered.   COMPARISON:  None Available.   FINDINGS: Segmentation: Presumed standard anatomy with the inferior-most well developed disc space designated as L5-S1.   Alignment:  Trace retrolisthesis at L2-3 and L3-4.   Vertebrae:  No fracture, evidence of discitis, or bone lesion.   Conus medullaris and cauda equina: Conus extends to the L1 level. Conus and cauda equina appear normal. Prominence of the epidural fat below the L3-4 disc level.   Paraspinal and other soft tissues: Negative.   Disc levels:   T12-L1 and L1-L2: Unremarkable.   L2-L3: Annular disc bulge and  mild bilateral facet hypertrophy. Prominence of the posterior epidural fat. No significant canal stenosis. Borderline-mild foraminal stenosis on the right.   L3-L4: Annular disc bulge and mild bilateral facet arthropathy. Prominence of the posterior epidural fat. Borderline-mild canal stenosis and mild bilateral foraminal stenosis.   L4-L5: Annular disc bulge and endplate spurring. Mild-to-moderate bilateral facet arthropathy. Prominence of the epidural fat. Moderate-severe canal stenosis with moderate bilateral foraminal stenosis.   L5-S1: Annular disc bulge and mild-moderate bilateral facet arthropathy. Prominence of the epidural fat compresses the thecal sac. Mild bilateral foraminal stenosis, right greater than left.   IMPRESSION: 1. Multilevel lumbar spondylosis, most pronounced at L4-5 where there is moderate-severe canal stenosis and moderate bilateral foraminal stenosis. 2. Prominence of the epidural fat contributes to thecal sac  narrowing at the L4-L5 and L5-S1 levels.     Electronically Signed   By: Leverne Reading D.O.   On: 06/21/2023 11:07   I reviewed his lumbar spine x-rays, no evidence of a mobile spondylolisthesis.  Awaiting final read.  I have personally reviewed the images and agree with the above interpretation.  Medical Decision Making/Assessment and Plan: Mr. Hjort is a pleasant 68 y.o. male with severe lumbar stenosis at L4-5 causing significant debilitating lumbar claudication symptoms.  He is had these worsening since December 2024 and has been under the care of physical medicine and rehabilitation team and has obtained multiple injections.  He has not had any significant improvement and instead feels that his ambulation is getting worse continually.  He needs to lean forward onto a support to be able to get more distance out of his ambulation, if he is not able to he has to sit down and lean forward to recover.  On physical exam he has mild weakness noted  in his dorsiflexion and EHL function, he has decreased reflexes at the right sided Achilles and medial hamstring and left-sided Achilles.  He does have some urinary urgency but no perineal sensation loss.  His imaging shows severe stenosis at L4-5 secondary to facet arthropathy and annular disc bulging.  Flexion-extension x-rays show no evidence of instability.  At this point the patient has had conservative management of his lumbar stenosis with severe claudication symptoms and continues to worsen progressively. He has had continued injections, without significant improvement. He was referred to us  by the physical medicine and rehabiltion team for surgical evaluation of lumbar claudication secondary to lumbar stenosis. Given his progression and failure of 6 months of conservative care and injections, we have discussed L4-5 lumbar laminectomy for decompression of his neural elements. We discussed risks and benefits of surgery including but not limited to CSF leak, nerve injury and need for further surgery seconday to destabilization.   Thank you for involving me in the care of this patient.    Carroll Clamp MD/MSCR Neurosurgery

## 2023-09-08 ENCOUNTER — Ambulatory Visit: Admitting: Neurosurgery

## 2023-09-08 ENCOUNTER — Ambulatory Visit
Admission: RE | Admit: 2023-09-08 | Discharge: 2023-09-08 | Disposition: A | Discharge status: Home / Self Care | Source: Ambulatory Visit | Attending: Neurosurgery | Admitting: Neurosurgery

## 2023-09-08 ENCOUNTER — Other Ambulatory Visit: Payer: Self-pay

## 2023-09-08 ENCOUNTER — Encounter: Payer: Self-pay | Admitting: Neurosurgery

## 2023-09-08 VITALS — BP 110/82 | Ht 72.0 in | Wt 184.2 lb

## 2023-09-08 DIAGNOSIS — M48062 Spinal stenosis, lumbar region with neurogenic claudication: Secondary | ICD-10-CM | POA: Insufficient documentation

## 2023-09-08 DIAGNOSIS — M47816 Spondylosis without myelopathy or radiculopathy, lumbar region: Secondary | ICD-10-CM | POA: Diagnosis not present

## 2023-09-08 DIAGNOSIS — R3915 Urgency of urination: Secondary | ICD-10-CM | POA: Insufficient documentation

## 2023-09-08 DIAGNOSIS — M48061 Spinal stenosis, lumbar region without neurogenic claudication: Secondary | ICD-10-CM | POA: Diagnosis not present

## 2023-09-08 DIAGNOSIS — Z01818 Encounter for other preprocedural examination: Secondary | ICD-10-CM

## 2023-09-08 DIAGNOSIS — M549 Dorsalgia, unspecified: Secondary | ICD-10-CM | POA: Diagnosis not present

## 2023-09-08 DIAGNOSIS — M5126 Other intervertebral disc displacement, lumbar region: Secondary | ICD-10-CM | POA: Diagnosis not present

## 2023-09-08 NOTE — Patient Instructions (Signed)
 Please see below for information in regards to your upcoming surgery:   Planned surgery: L4-5 laminectomy   Surgery date: 09/23/23 at [facility]: [address], [place], [place] [address]) - you will find out your arrival time the business day before your surgery.   Pre-op appointment at [facility] Pre-admit Testing: you will receive a call with a date/time for this appointment. If you are scheduled for an in person appointment, Pre-admit Testing is located on the first floor of the [facility] building, [address] [facility] [address], [address]. During this appointment, they will advise you which medications you can take the morning of surgery, and which medications you will need to hold for surgery. Labs (such as blood work, EKG) may be done at your pre-op appointment. You are not required to fast for these labs. Should you need to change your pre-op appointment, please call Pre-admit testing at [phone].    Common restrictions after surgery: No bending, lifting, or twisting ("BLT"). Avoid lifting objects heavier than 10 pounds for the first 6 weeks after surgery. Where possible, avoid household activities that involve lifting, bending, reaching, pushing, or pulling such as laundry, vacuuming, grocery shopping, and childcare. Try to arrange for help from friends and family for these activities while you heal. Do not drive while taking prescription pain medication. Weeks 6 through 12 after surgery: avoid lifting more than 25 pounds.     How to contact us :  If you have any questions/concerns before or after surgery, you can reach us  at [phone], or you can send a mychart message. We can be reached by phone or mychart 8am-4pm, Monday-Friday.  *Please note: Calls after 4pm are forwarded to a third party answering service. Mychart messages are not routinely monitored during evenings, weekends, and holidays. Please call our office to contact the answering  service for urgent concerns during non-business hours.    If you have FMLA/disability paperwork, please drop it off or fax it to [phone], attention Patty.   Appointments/FMLA & disability paperwork: Gerlean Kocher, & Maryann Smalls Registered Nurses/Surgery schedulers: Damian Hofstra & Lauren Medical Assistants: Donnajean Fuse Physician Assistants: Ludwig Safer, PA-C, Anastacio Karvonen, PA-C & Lucetta Russel, PA-C Surgeons: Jodeen Munch, MD & Henderson Lock, MD   [facility] PREADMIT TESTING VISIT and SURGERY INFORMATION SHEET   Now that surgery has been scheduled you can anticipate several phone calls from [facility] services. A pharmacy technician will call you to verify your current list of medications taken at home.               The Pre-Service Center will call to verify your insurance information and to give you billing estimates and information.             The Preadmit Testing Office will be calling to schedule a visit to obtain information for the anesthesia team and provide instructions on preparation for surgery.  What can you expect for the Preadmit Testing Visit: Appointments may be scheduled in-person or by telephone.  If a telephone visit is scheduled, you may be asked to come into the office to have lab tests or other studies performed.   This visit will not be completed any greater than 14 days prior to your surgery.  If your surgery has been scheduled for a future date, please do not be alarmed if we have not contacted you to schedule an appointment more than a month prior to the surgery date.    Please be  prepared to provide the following information during this appointment:            -Personal medical history                                               -Medication and allergy list            -Any history of problems with anesthesia              -Recent lab work or diagnostic studies            -Please notify us  of any needs we should be aware of to  provide the best care possible           -You will be provided with instructions on how to prepare for your surgery.    On The Day of Surgery:  You must have a driver to take you home after surgery, you will be asked not to drive for 24 hours following surgery.  Taxi, [employer] and non-medical transport will not be acceptable means of transportation unless you have a responsible individual who will be traveling with you.  Visitors in the surgical area:   2 people will be able to visit you in your room once your preparation for surgery has been completed. During surgery, your visitors will be asked to wait in the Surgery Waiting Area.  It is not a requirement for them to stay, if they prefer to leave and come back.  Your visitor(s) will be given an update once the surgery has been completed.  No visitors are allowed in the initial recovery room to respect patient privacy and safety.  Once you are more awake and transfer to the secondary recovery area, or are transferred to an inpatient room, visitors will again be able to see you.  To respect and protect your privacy: We will ask on the day of surgery who your driver will be and what the contact number for that individual will be. We will ask if it is okay to share information with this individual, or if there is an alternative individual that we, or the surgeon, should contact to provide updates and information. If family or friends come to the surgical information desk requesting information about you, who you have not listed with us , no information will be given.   It may be helpful to designate someone as the main contact who will be responsible for updating your other friends and family.    PREADMIT TESTING OFFICE: [phone] SAME DAY SURGERY: [phone] We look forward to caring for you before and throughout the process of your surgery.

## 2023-09-10 ENCOUNTER — Other Ambulatory Visit: Payer: Self-pay | Admitting: Family Medicine

## 2023-09-10 ENCOUNTER — Inpatient Hospital Stay
Admission: RE | Admit: 2023-09-10 | Discharge: 2023-09-10 | Disposition: A | Discharge status: Home / Self Care | Payer: Self-pay | Source: Ambulatory Visit | Attending: Physician Assistant | Admitting: Physician Assistant

## 2023-09-10 DIAGNOSIS — Z049 Encounter for examination and observation for unspecified reason: Secondary | ICD-10-CM

## 2023-09-16 ENCOUNTER — Other Ambulatory Visit: Payer: Self-pay

## 2023-09-16 ENCOUNTER — Encounter
Admission: RE | Admit: 2023-09-16 | Discharge: 2023-09-16 | Disposition: A | Discharge status: Home / Self Care | Source: Ambulatory Visit | Attending: Neurosurgery | Admitting: Neurosurgery

## 2023-09-16 VITALS — BP 129/83 | HR 112 | Resp 16 | Ht 72.0 in | Wt 185.3 lb

## 2023-09-16 DIAGNOSIS — I10 Essential (primary) hypertension: Secondary | ICD-10-CM | POA: Insufficient documentation

## 2023-09-16 DIAGNOSIS — R000 Tachycardia, unspecified: Secondary | ICD-10-CM | POA: Insufficient documentation

## 2023-09-16 DIAGNOSIS — Z01810 Encounter for preprocedural cardiovascular examination: Secondary | ICD-10-CM | POA: Diagnosis not present

## 2023-09-16 DIAGNOSIS — Z01818 Encounter for other preprocedural examination: Secondary | ICD-10-CM | POA: Insufficient documentation

## 2023-09-16 DIAGNOSIS — Z01812 Encounter for preprocedural laboratory examination: Secondary | ICD-10-CM

## 2023-09-16 DIAGNOSIS — J449 Chronic obstructive pulmonary disease, unspecified: Secondary | ICD-10-CM | POA: Insufficient documentation

## 2023-09-16 HISTORY — DX: Solitary pulmonary nodule: R91.1

## 2023-09-16 HISTORY — DX: Family history of other specified conditions: Z84.89

## 2023-09-16 HISTORY — DX: Tachycardia, unspecified: R00.0

## 2023-09-16 HISTORY — DX: Cocaine abuse, in remission: F14.11

## 2023-09-16 HISTORY — DX: Tobacco use: Z72.0

## 2023-09-16 HISTORY — DX: Spinal stenosis, lumbar region without neurogenic claudication: M48.061

## 2023-09-16 HISTORY — DX: Alcohol abuse, uncomplicated: F10.10

## 2023-09-16 HISTORY — DX: Unspecified foreign body in bronchus causing asphyxiation, initial encounter: T17.500A

## 2023-09-16 LAB — TYPE AND SCREEN
ABO/RH(D): O POS
Antibody Screen: NEGATIVE

## 2023-09-16 LAB — SURGICAL PCR SCREEN
MRSA, PCR: NEGATIVE
Staphylococcus aureus: NEGATIVE

## 2023-09-16 NOTE — Patient Instructions (Addendum)
 Your procedure is scheduled on:09-23-23 Thursday Report to the Registration Desk on the 1st floor of the [facility].Then proceed to the 2nd floor Surgery Desk To find out your arrival time, please call [phone] between 1PM - 3PM on:09-22-23 Wednesday If your arrival time is 6:00 am, do not arrive before that time as the [facility] entrance doors do not open until 6:00 am.  REMEMBER: Instructions that are not followed completely may result in serious medical risk, up to and including death; or upon the discretion of your surgeon and anesthesiologist your surgery may need to be rescheduled.  Do not eat food after midnight the night before surgery.  No gum chewing or hard candies.  You may however, drink CLEAR liquids up to 2 hours before you are scheduled to arrive for your surgery. Do not drink anything within 2 hours of your scheduled arrival time.  Clear liquids include: - water  - apple juice without pulp - gatorade (not RED colors) - black coffee or tea (Do NOT add milk or creamers to the coffee or tea) Do NOT drink anything that is not on this list.  One week prior to surgery: Stop ANY OVER THE COUNTER supplements until after surgery.  Continue taking all of your other prescription medications up until the day of surgery.  ON THE DAY OF SURGERY ONLY TAKE THESE MEDICATIONS WITH SIPS OF WATER: -omeprazole (PRILOSEC)  -amLODipine  (NORVASC )  -HYDROcodone-acetaminophen  (NORCO/VICODIN)   No Alcohol for 24 hours before or after surgery.  No Smoking including e-cigarettes for 24 hours before surgery.  No chewable tobacco products for at least 6 hours before surgery.  No nicotine  patches on the day of surgery.  Do not use any recreational drugs for at least a week (preferably 2 weeks) before your surgery.  Please be advised that the combination of cocaine and anesthesia may have negative outcomes, up to and including death. If you test positive for cocaine, your surgery  will be cancelled.  On the morning of surgery brush your teeth with toothpaste and water, you may rinse your mouth with mouthwash if you wish. Do not swallow any toothpaste or mouthwash.  Use CHG Soap as directed on instruction sheet.  Do not wear jewelry, make-up, hairpins, clips or nail polish.  For welded (permanent) jewelry: bracelets, anklets, waist bands, etc.  Please have this removed prior to surgery.  If it is not removed, there is a chance that hospital personnel will need to cut it off on the day of surgery.  Do not wear lotions, powders, or perfumes.   Do not shave body hair from the neck down 48 hours before surgery.  Contact lenses, hearing aids and dentures may not be worn into surgery.  Do not bring valuables to the hospital. [facility] is not responsible for any missing/lost belongings or valuables.   Notify your doctor if there is any change in your medical condition (cold, fever, infection).  Wear comfortable clothing (specific to your surgery type) to the hospital.  After surgery, you can help prevent lung complications by doing breathing exercises.  Take deep breaths and cough every 1-2 hours. Your doctor may order a device called an Incentive Spirometer to help you take deep breaths. When coughing or sneezing, hold a pillow firmly against your incision with both hands. This is called "splinting." Doing this helps protect your incision. It also decreases belly discomfort.  If you are being admitted to the hospital overnight, leave your suitcase in the car. After surgery  it may be brought to your room.  In case of increased patient census, it may be necessary for you, the patient, to continue your postoperative care in the Same Day Surgery department.  If you are being discharged the day of surgery, you will not be allowed to drive home. You will need a responsible individual to drive you home and stay with you for 24 hours after surgery.   If you are taking  public transportation, you will need to have a responsible individual with you.  Please call the Pre-admissions Testing Dept. at [phone] if you have any questions about these instructions.  Surgery Visitation Policy:  Patients having surgery or a procedure may have two visitors.  Children under the age of 30 must have an adult with them who is not the patient.  Inpatient Visitation:    Visiting hours are 7 a.m. to 8 p.m. Up to four visitors are allowed at one time in a patient room. The visitors may rotate out with other people during the day.  One visitor age 83 or older may stay with the patient overnight and must be in the room by 8 p.m.    Pre-operative 5 CHG Bath Instructions   You can play a key role in reducing the risk of infection after surgery. Your skin needs to be as free of germs as possible. You can reduce the number of germs on your skin by washing with CHG (chlorhexidine  gluconate) soap before surgery. CHG is an antiseptic soap that kills germs and continues to kill germs even after washing.   DO NOT use if you have an allergy to chlorhexidine /CHG or antibacterial soaps. If your skin becomes reddened or irritated, stop using the CHG and notify one of our RNs at [phone].   Please shower with the CHG soap starting 4 days before surgery using the following schedule:     Please keep in mind the following:  DO NOT shave, including legs and underarms, starting the day of your first shower.   You may shave your face at any point before/day of surgery.  Place clean sheets on your bed the day you start using CHG soap. Use a clean washcloth (not used since being washed) for each shower. DO NOT sleep with pets once you start using the CHG.   CHG Shower Instructions:  If you choose to wash your hair and private area, wash first with your normal shampoo/soap.  After you use shampoo/soap, rinse your hair and body thoroughly to remove shampoo/soap residue.  Turn the  water OFF and apply about 3 tablespoons (45 ml) of CHG soap to a CLEAN washcloth.  Apply CHG soap ONLY FROM YOUR NECK DOWN TO YOUR TOES (washing for 3-5 minutes)  DO NOT use CHG soap on face, private areas, open wounds, or sores.  Pay special attention to the area where your surgery is being performed.  If you are having back surgery, having someone wash your back for you may be helpful. Wait 2 minutes after CHG soap is applied, then you may rinse off the CHG soap.  Pat dry with a clean towel  Put on clean clothes/pajamas   If you choose to wear lotion, please use ONLY the CHG-compatible lotions on the back of this paper.     Additional instructions for the day of surgery: DO NOT APPLY any lotions, deodorants, cologne, or perfumes.   Put on clean/comfortable clothes.  Brush your teeth.  Ask your nurse before applying any prescription medications  to the skin.      CHG Compatible Lotions   Aveeno Moisturizing lotion  Cetaphil Moisturizing Cream  Cetaphil Moisturizing Lotion  Clairol Herbal Essence Moisturizing Lotion, Dry Skin  Clairol Herbal Essence Moisturizing Lotion, Extra Dry Skin  Clairol Herbal Essence Moisturizing Lotion, Normal Skin  Curel Age Defying Therapeutic Moisturizing Lotion with Alpha Hydroxy  Curel Extreme Care Body Lotion  Curel Soothing Hands Moisturizing Hand Lotion  Curel Therapeutic Moisturizing Cream, Fragrance-Free  Curel Therapeutic Moisturizing Lotion, Fragrance-Free  Curel Therapeutic Moisturizing Lotion, Original Formula  Eucerin Daily Replenishing Lotion  Eucerin Dry Skin Therapy Plus Alpha Hydroxy Crme  Eucerin Dry Skin Therapy Plus Alpha Hydroxy Lotion  Eucerin Original Crme  Eucerin Original Lotion  Eucerin Plus Crme Eucerin Plus Lotion  Eucerin TriLipid Replenishing Lotion  Keri Anti-Bacterial Hand Lotion  Keri Deep Conditioning Original Lotion Dry Skin Formula Softly Scented  Keri Deep Conditioning Original Lotion, Fragrance Free  Sensitive Skin Formula  Keri Lotion Fast Absorbing Fragrance Free Sensitive Skin Formula  Keri Lotion Fast Absorbing Softly Scented Dry Skin Formula  Keri Original Lotion  Keri Skin Renewal Lotion Keri Silky Smooth Lotion  Keri Silky Smooth Sensitive Skin Lotion  Nivea Body Creamy Conditioning Oil  Nivea Body Extra Enriched Teacher, adult education Moisturizing Lotion Nivea Crme  Nivea Skin Firming Lotion  NutraDerm 30 Skin Lotion  NutraDerm Skin Lotion  NutraDerm Therapeutic Skin Cream  NutraDerm Therapeutic Skin Lotion  ProShield Protective Hand Cream  Provon moisturizing lotion

## 2023-09-17 ENCOUNTER — Ambulatory Visit: Payer: Self-pay | Admitting: Neurosurgery

## 2023-09-22 MED ORDER — CHLORHEXIDINE GLUCONATE 0.12 % MT SOLN
15.0000 mL | Freq: Once | OROMUCOSAL | Status: AC
  Administered 2023-09-23: 15 mL via OROMUCOSAL

## 2023-09-22 MED ORDER — ORAL CARE MOUTH RINSE
15.0000 mL | Freq: Once | OROMUCOSAL | Status: AC
Start: 2023-09-22 — End: 2023-09-23

## 2023-09-22 MED ORDER — CEFAZOLIN IN SODIUM CHLORIDE 2-0.9 GM/100ML-% IV SOLN
2.0000 g | Freq: Once | INTRAVENOUS | Status: DC
  Filled 2023-09-22: qty 100

## 2023-09-22 MED ORDER — LACTATED RINGERS IV SOLN: INTRAVENOUS | Status: DC

## 2023-09-23 ENCOUNTER — Ambulatory Visit: Admitting: Certified Registered Nurse Anesthetist

## 2023-09-23 ENCOUNTER — Ambulatory Visit

## 2023-09-23 ENCOUNTER — Encounter: Payer: Self-pay | Admitting: Neurosurgery

## 2023-09-23 ENCOUNTER — Other Ambulatory Visit: Payer: Self-pay

## 2023-09-23 ENCOUNTER — Encounter
Admission: RE | Disposition: A | Discharge status: Home / Self Care | Payer: Self-pay | Source: Home / Self Care | Attending: Neurosurgery

## 2023-09-23 ENCOUNTER — Ambulatory Visit
Admission: RE | Admit: 2023-09-23 | Discharge: 2023-09-23 | Disposition: A | Discharge status: Home / Self Care | Attending: Neurosurgery | Admitting: Neurosurgery

## 2023-09-23 ENCOUNTER — Ambulatory Visit: Payer: Self-pay | Admitting: Urgent Care

## 2023-09-23 DIAGNOSIS — Z79899 Other long term (current) drug therapy: Secondary | ICD-10-CM | POA: Insufficient documentation

## 2023-09-23 DIAGNOSIS — R3915 Urgency of urination: Secondary | ICD-10-CM | POA: Diagnosis not present

## 2023-09-23 DIAGNOSIS — I10 Essential (primary) hypertension: Secondary | ICD-10-CM | POA: Insufficient documentation

## 2023-09-23 DIAGNOSIS — F17290 Nicotine dependence, other tobacco product, uncomplicated: Secondary | ICD-10-CM | POA: Insufficient documentation

## 2023-09-23 DIAGNOSIS — M4726 Other spondylosis with radiculopathy, lumbar region: Secondary | ICD-10-CM | POA: Insufficient documentation

## 2023-09-23 DIAGNOSIS — G709 Myoneural disorder, unspecified: Secondary | ICD-10-CM | POA: Diagnosis not present

## 2023-09-23 DIAGNOSIS — J449 Chronic obstructive pulmonary disease, unspecified: Secondary | ICD-10-CM | POA: Diagnosis not present

## 2023-09-23 DIAGNOSIS — Z8249 Family history of ischemic heart disease and other diseases of the circulatory system: Secondary | ICD-10-CM | POA: Diagnosis not present

## 2023-09-23 DIAGNOSIS — Z01818 Encounter for other preprocedural examination: Secondary | ICD-10-CM

## 2023-09-23 DIAGNOSIS — M48062 Spinal stenosis, lumbar region with neurogenic claudication: Secondary | ICD-10-CM | POA: Diagnosis not present

## 2023-09-23 DIAGNOSIS — M1990 Unspecified osteoarthritis, unspecified site: Secondary | ICD-10-CM | POA: Diagnosis not present

## 2023-09-23 DIAGNOSIS — K219 Gastro-esophageal reflux disease without esophagitis: Secondary | ICD-10-CM | POA: Insufficient documentation

## 2023-09-23 DIAGNOSIS — F17200 Nicotine dependence, unspecified, uncomplicated: Secondary | ICD-10-CM | POA: Diagnosis not present

## 2023-09-23 DIAGNOSIS — M5116 Intervertebral disc disorders with radiculopathy, lumbar region: Secondary | ICD-10-CM | POA: Diagnosis not present

## 2023-09-23 HISTORY — PX: LUMBAR LAMINECTOMY/DECOMPRESSION MICRODISCECTOMY: SHX5026

## 2023-09-23 SURGERY — LUMBAR LAMINECTOMY/DECOMPRESSION MICRODISCECTOMY 1 LEVEL
Anesthesia: General | Site: Spine Lumbar

## 2023-09-23 MED ORDER — ACETAMINOPHEN 10 MG/ML IV SOLN
INTRAVENOUS | Status: DC | PRN
  Administered 2023-09-23: 1000 mg via INTRAVENOUS

## 2023-09-23 MED ORDER — BUPIVACAINE-EPINEPHRINE (PF) 0.5% -1:200000 IJ SOLN
INTRAMUSCULAR | Status: DC | PRN
  Administered 2023-09-23: 10 mL

## 2023-09-23 MED ORDER — ONDANSETRON HCL 4 MG/2ML IJ SOLN
INTRAMUSCULAR | Status: DC | PRN
  Administered 2023-09-23: 4 mg via INTRAVENOUS

## 2023-09-23 MED ORDER — FENTANYL CITRATE (PF) 100 MCG/2ML IJ SOLN
INTRAMUSCULAR | Status: AC
  Filled 2023-09-23: qty 2

## 2023-09-23 MED ORDER — METHOCARBAMOL 500 MG PO TABS
500.0000 mg | ORAL_TABLET | Freq: Four times a day (QID) | ORAL | 0 refills | Status: DC | PRN
  Filled 2023-09-23: qty 120, 30d supply, fill #0

## 2023-09-23 MED ORDER — ROCURONIUM BROMIDE 100 MG/10ML IV SOLN
INTRAVENOUS | Status: DC | PRN
  Administered 2023-09-23: 20 mg via INTRAVENOUS
  Administered 2023-09-23: 50 mg via INTRAVENOUS

## 2023-09-23 MED ORDER — LIDOCAINE HCL (CARDIAC) PF 100 MG/5ML IV SOSY
PREFILLED_SYRINGE | INTRAVENOUS | Status: DC | PRN
  Administered 2023-09-23: 100 mg via INTRAVENOUS

## 2023-09-23 MED ORDER — PROPOFOL 10 MG/ML IV BOLUS
INTRAVENOUS | Status: DC | PRN
  Administered 2023-09-23: 160 mg via INTRAVENOUS

## 2023-09-23 MED ORDER — CHLORHEXIDINE GLUCONATE 0.12 % MT SOLN
OROMUCOSAL | Status: AC
  Filled 2023-09-23: qty 15

## 2023-09-23 MED ORDER — KETOROLAC TROMETHAMINE 30 MG/ML IJ SOLN
INTRAMUSCULAR | Status: AC
  Filled 2023-09-23: qty 1

## 2023-09-23 MED ORDER — ACETAMINOPHEN 10 MG/ML IV SOLN
INTRAVENOUS | Status: AC
  Filled 2023-09-23: qty 100

## 2023-09-23 MED ORDER — DEXAMETHASONE SODIUM PHOSPHATE 10 MG/ML IJ SOLN
INTRAMUSCULAR | Status: AC
  Filled 2023-09-23: qty 1

## 2023-09-23 MED ORDER — LIDOCAINE HCL (PF) 2 % IJ SOLN
INTRAMUSCULAR | Status: AC
  Filled 2023-09-23: qty 5

## 2023-09-23 MED ORDER — ONDANSETRON HCL 4 MG/2ML IJ SOLN
INTRAMUSCULAR | Status: AC
  Filled 2023-09-23: qty 2

## 2023-09-23 MED ORDER — OXYCODONE HCL 5 MG PO TABS
5.0000 mg | ORAL_TABLET | Freq: Once | ORAL | Status: AC | PRN
  Administered 2023-09-23: 5 mg via ORAL

## 2023-09-23 MED ORDER — GLYCOPYRROLATE 0.2 MG/ML IJ SOLN
INTRAMUSCULAR | Status: DC | PRN
  Administered 2023-09-23: .2 mg via INTRAVENOUS

## 2023-09-23 MED ORDER — CEFAZOLIN SODIUM-DEXTROSE 2-4 GM/100ML-% IV SOLN
2.0000 g | INTRAVENOUS | Status: AC
  Administered 2023-09-23: 2 g via INTRAVENOUS

## 2023-09-23 MED ORDER — PHENYLEPHRINE HCL-NACL 20-0.9 MG/250ML-% IV SOLN
INTRAVENOUS | Status: DC | PRN
  Administered 2023-09-23: 30 ug/min via INTRAVENOUS

## 2023-09-23 MED ORDER — SODIUM CHLORIDE (PF) 0.9 % IJ SOLN: INTRAMUSCULAR | Status: DC | PRN

## 2023-09-23 MED ORDER — METHYLPREDNISOLONE ACETATE 40 MG/ML IJ SUSP: INTRAMUSCULAR | Status: DC | PRN

## 2023-09-23 MED ORDER — ROCURONIUM BROMIDE 10 MG/ML (PF) SYRINGE
PREFILLED_SYRINGE | INTRAVENOUS | Status: AC
  Filled 2023-09-23: qty 10

## 2023-09-23 MED ORDER — PHENYLEPHRINE HCL-NACL 20-0.9 MG/250ML-% IV SOLN
INTRAVENOUS | Status: AC
  Filled 2023-09-23: qty 250

## 2023-09-23 MED ORDER — KETAMINE HCL 50 MG/5ML IJ SOSY
PREFILLED_SYRINGE | INTRAMUSCULAR | Status: AC
  Filled 2023-09-23: qty 5

## 2023-09-23 MED ORDER — KETOROLAC TROMETHAMINE 30 MG/ML IJ SOLN
INTRAMUSCULAR | Status: DC | PRN
  Administered 2023-09-23: 30 mg via INTRAVENOUS

## 2023-09-23 MED ORDER — SUGAMMADEX SODIUM 200 MG/2ML IV SOLN
INTRAVENOUS | Status: DC | PRN
  Administered 2023-09-23: 300 mg via INTRAVENOUS

## 2023-09-23 MED ORDER — KETAMINE HCL 50 MG/5ML IJ SOSY
PREFILLED_SYRINGE | INTRAMUSCULAR | Status: DC | PRN
Start: 2023-09-23 — End: 2023-09-23
  Administered 2023-09-23: 20 mg via INTRAVENOUS

## 2023-09-23 MED ORDER — DEXAMETHASONE SODIUM PHOSPHATE 10 MG/ML IJ SOLN
INTRAMUSCULAR | Status: DC | PRN
  Administered 2023-09-23: 10 mg via INTRAVENOUS

## 2023-09-23 MED ORDER — PHENYLEPHRINE 80 MCG/ML (10ML) SYRINGE FOR IV PUSH (FOR BLOOD PRESSURE SUPPORT)
PREFILLED_SYRINGE | INTRAVENOUS | Status: DC | PRN
  Administered 2023-09-23: 80 ug via INTRAVENOUS
  Administered 2023-09-23: 40 ug via INTRAVENOUS

## 2023-09-23 MED ORDER — CEFAZOLIN SODIUM-DEXTROSE 2-4 GM/100ML-% IV SOLN
INTRAVENOUS | Status: AC
  Filled 2023-09-23: qty 100

## 2023-09-23 MED ORDER — FENTANYL CITRATE (PF) 100 MCG/2ML IJ SOLN
INTRAMUSCULAR | Status: DC | PRN
  Administered 2023-09-23 (×3): 50 ug via INTRAVENOUS

## 2023-09-23 MED ORDER — OXYCODONE HCL 5 MG PO TABS
ORAL_TABLET | ORAL | Status: AC
  Filled 2023-09-23: qty 1

## 2023-09-23 MED ORDER — PROPOFOL 10 MG/ML IV BOLUS
INTRAVENOUS | Status: AC
  Filled 2023-09-23: qty 20

## 2023-09-23 MED ORDER — OXYCODONE HCL 5 MG/5ML PO SOLN: 5.0000 mg | Freq: Once | ORAL | Status: AC | PRN

## 2023-09-23 MED ORDER — OXYCODONE HCL 5 MG PO TABS
5.0000 mg | ORAL_TABLET | ORAL | 0 refills | Status: AC | PRN
  Filled 2023-09-23: qty 30, 5d supply, fill #0

## 2023-09-23 MED ORDER — SURGIFLO WITH THROMBIN (HEMOSTATIC MATRIX KIT) OPTIME: TOPICAL | Status: DC | PRN

## 2023-09-23 MED ORDER — FENTANYL CITRATE (PF) 100 MCG/2ML IJ SOLN: 25.0000 ug | INTRAMUSCULAR | Status: DC | PRN

## 2023-09-23 MED ORDER — FENTANYL CITRATE (PF) 100 MCG/2ML IJ SOLN
INTRAMUSCULAR | Status: AC
Start: 2023-09-23 — End: 2023-09-23
  Filled 2023-09-23: qty 2

## 2023-09-23 MED ORDER — SENNA 8.6 MG PO TABS
1.0000 | ORAL_TABLET | Freq: Two times a day (BID) | ORAL | 0 refills | Status: AC | PRN
Start: 2023-09-23 — End: ?
  Filled 2023-09-23: qty 30, 15d supply, fill #0

## 2023-09-23 SURGICAL SUPPLY — 33 items
BASIN KIT SINGLE STR (MISCELLANEOUS) ×1 IMPLANT
BRUSH SCRUB EZ 4% CHG (MISCELLANEOUS) ×1 IMPLANT
BUR NEURO DRILL SOFT 3.0X3.8M (BURR) ×1 IMPLANT
DERMABOND ADVANCED .7 DNX12 (GAUZE/BANDAGES/DRESSINGS) ×1 IMPLANT
DRAPE C-ARM XRAY 36X54 (DRAPES) ×2 IMPLANT
DRAPE LAPAROTOMY 100X77 ABD (DRAPES) ×1 IMPLANT
DRAPE MICROSCOPE SPINE 48X150 (DRAPES) ×1 IMPLANT
DRSG OPSITE POSTOP 3X4 (GAUZE/BANDAGES/DRESSINGS) ×1 IMPLANT
DRSG OPSITE POSTOP 4X6 (GAUZE/BANDAGES/DRESSINGS) IMPLANT
DRSG TEGADERM 4X4.75 (GAUZE/BANDAGES/DRESSINGS) IMPLANT
ELECTRODE EZSTD 165MM 6.5IN (MISCELLANEOUS) ×1 IMPLANT
ELECTRODE REM PT RTRN 9FT ADLT (ELECTROSURGICAL) ×1 IMPLANT
GLOVE BIOGEL PI IND STRL 7.0 (GLOVE) ×1 IMPLANT
GLOVE BIOGEL PI IND STRL 8 (GLOVE) ×2 IMPLANT
GLOVE SURG SYN 7.0 (GLOVE) ×1 IMPLANT
GLOVE SURG SYN 7.0 PF PI (GLOVE) ×1 IMPLANT
GLOVE SURG SYN 7.5 E (GLOVE) ×1 IMPLANT
GLOVE SURG SYN 7.5 PF PI (GLOVE) ×1 IMPLANT
GOWN SRG XL LVL 3 NONREINFORCE (GOWNS) ×1 IMPLANT
GOWN STRL REUS W/ TWL LRG LVL3 (GOWN DISPOSABLE) ×1 IMPLANT
KIT WILSON FRAME (KITS) ×1 IMPLANT
KNIFE BAYONET SHORT DISCETOMY (MISCELLANEOUS) IMPLANT
NDL SAFETY ECLIPSE 18X1.5 (NEEDLE) ×1 IMPLANT
NS IRRIG 500ML POUR BTL (IV SOLUTION) ×1 IMPLANT
PACK LAMINECTOMY ARMC (PACKS) ×1 IMPLANT
PAD ARMBOARD POSITIONER FOAM (MISCELLANEOUS) ×2 IMPLANT
SURGIFLO W/THROMBIN 8M KIT (HEMOSTASIS) ×1 IMPLANT
SUT STRATA 3-0 15 PS-2 (SUTURE) ×1 IMPLANT
SUT VIC AB 0 CT1 27XCR 8 STRN (SUTURE) ×1 IMPLANT
SUT VIC AB 2-0 CT1 18 (SUTURE) ×1 IMPLANT
SYR 30ML LL (SYRINGE) ×2 IMPLANT
SYR 3ML LL SCALE MARK (SYRINGE) ×1 IMPLANT
TRAP FLUID SMOKE EVACUATOR (MISCELLANEOUS) ×1 IMPLANT

## 2023-09-23 NOTE — Interval H&P Note (Signed)
 History and Physical Interval Note:  09/23/2023 12:55 PM  David Clark  has presented today for surgery, with the diagnosis of M48.062 Spinal stenosis, lumbar region, with neurogenic claudication R39.15 Urinary urgency.  The various methods of treatment have been discussed with the patient and family. After consideration of risks, benefits and other options for treatment, the patient has consented to  Procedure(s) with comments: LUMBAR LAMINECTOMY/DECOMPRESSION MICRODISCECTOMY 1 LEVEL (N/A) - L4-5 LAMINECTOMY as a surgical intervention.  The patient's history has been reviewed, patient examined, no change in status, stable for surgery.  I have reviewed the patient's chart and labs.  Questions were answered to the patient's satisfaction.    Heart and Lungs are clear   Carroll Clamp

## 2023-09-23 NOTE — Discharge Instructions (Signed)

## 2023-09-23 NOTE — Discharge Summary (Signed)
 Discharge Summary  Patient ID: David Clark MRN: [id number] DOB/AGE: 05/09/1955 68 y.o.  Admit date: 09/23/2023 Discharge date: 09/23/2023  Admission Diagnoses:  Discharge Diagnoses:  Principal Problem:   Spinal stenosis, lumbar region, with neurogenic claudication   Discharged Condition: good  Hospital Course: ***  Consults: None  Significant Diagnostic Studies: NA  Treatments: surgery: as above. Please see separately dictated operative report for further details.   Discharge Exam: Blood pressure 118/83, pulse 94, temperature 97.9 F (36.6 C), temperature source Temporal, resp. rate 16, SpO2 95%. {physical [id number]}  Disposition: Discharge disposition: 01-Home or Self Care        Allergies as of 09/23/2023   No Known Allergies      Medication List     TAKE these medications    amLODipine  5 MG tablet Commonly known as: NORVASC  Take 5 mg by mouth every morning.   HYDROcodone-acetaminophen  5-325 MG tablet Commonly known as: NORCO/VICODIN Take 1 tablet by mouth 2 (two) times daily.   methocarbamol  500 MG tablet Commonly known as: ROBAXIN  Take 1 tablet (500 mg total) by mouth every 6 (six) hours as needed for muscle spasms.   omeprazole 20 MG capsule Commonly known as: PRILOSEC Take 20 mg by mouth every morning.   oxyCODONE  5 MG immediate release tablet Commonly known as: Roxicodone  Take 1 tablet (5 mg total) by mouth every 4 (four) hours as needed for up to 5 days for severe pain (pain score 7-10) (refractory to home Norco).   senna 8.6 MG Tabs tablet Commonly known as: SENOKOT Take 1 tablet (8.6 mg total) by mouth 2 (two) times daily as needed.   Trelegy Ellipta 100-62.5-25 MCG/ACT Aepb Generic drug: Fluticasone-Umeclidin-Vilant Inhale 1 puff into the lungs daily as needed (shortness of breath).         Signed: Noble Bateman 09/23/2023, 2:27 PM

## 2023-09-23 NOTE — Anesthesia Preprocedure Evaluation (Addendum)
 Anesthesia Evaluation  Patient identified by MRN, date of birth, ID band Patient awake    Reviewed: Allergy & Precautions, NPO status , Patient's Chart, lab work & pertinent test results  History of Anesthesia Complications Negative for: history of anesthetic complications  Airway Mallampati: IV  TM Distance: >3 FB Neck ROM: full    Dental  (+) Upper Dentures, Lower Dentures   Pulmonary COPD, Current Smoker and Patient abstained from smoking.   Pulmonary exam normal        Cardiovascular hypertension, On Medications Normal cardiovascular exam     Neuro/Psych  Neuromuscular disease  negative psych ROS   GI/Hepatic Neg liver ROS,GERD  Medicated,,  Endo/Other  negative endocrine ROS    Renal/GU      Musculoskeletal  (+) Arthritis ,    Abdominal   Peds  Hematology negative hematology ROS (+)   Anesthesia Other Findings Past Medical History: No date: Adenocarcinoma of upper lobe of right lung (HCC) No date: Alcohol abuse No date: Arthritis No date: BPH (benign prostatic hyperplasia) No date: Cervical disc disease No date: COPD (chronic obstructive pulmonary disease) (HCC) No date: COVID No date: Family history of adverse reaction to anesthesia     Comment:  dad-delayed emergence No date: GERD (gastroesophageal reflux disease) No date: Gout 1993: Hepatitis B No date: History of cocaine abuse (HCC)     Comment:  in the 1980's No date: HLD (hyperlipidemia) No date: Hypertension No date: Hypomagnesemia No date: Hyponatremia No date: Lumbar spinal stenosis No date: Mucus plugging of bronchi No date: Pneumonia No date: Pulmonary nodule No date: Sepsis (HCC) No date: Sinus tachycardia No date: Squamous cell carcinoma, arm, left No date: Tobacco abuse  Past Surgical History: 01/01/2021: ANTERIOR CERVICAL DECOMP/DISCECTOMY FUSION; N/A     Comment:  Procedure: C5-6 ANTERIOR CERVICAL                DECOMPRESSION/DISCECTOMY FUSION 1 LEVEL;  Surgeon:               Jodeen Munch, MD;  Location: [facility] ORS;  Service:               Neurosurgery;  Laterality: N/A; No date: Excision skin lesion- skin cancer 07/24/2022: VIDEO BRONCHOSCOPY WITH ENDOBRONCHIAL ULTRASOUND; N/A     Comment:  Procedure: VIDEO BRONCHOSCOPY WITH ENDOBRONCHIAL               ULTRASOUND;  Surgeon: Erskin Hearing, MD;  Location:               [facility] ORS;  Service: Thoracic;  Laterality: N/A;     Reproductive/Obstetrics negative OB ROS                             Anesthesia Physical Anesthesia Plan  ASA: 3  Anesthesia Plan: General ETT   Post-op Pain Management: Toradol IV (intra-op)*, Ofirmev  IV (intra-op)* and Ketamine  IV*   Induction: Intravenous  PONV Risk Score and Plan: 2 and Ondansetron , Dexamethasone  and Treatment may vary due to age or medical condition  Airway Management Planned: Oral ETT  Additional Equipment:   Intra-op Plan:   Post-operative Plan: Extubation in OR  Informed Consent: I have reviewed the patients History and Physical, chart, labs and discussed the procedure including the risks, benefits and alternatives for the proposed anesthesia with the patient or authorized representative who has indicated his/her understanding and acceptance.     Dental Advisory Given  Plan Discussed with: Anesthesiologist, CRNA  and Surgeon  Anesthesia Plan Comments: (Patient consented for risks of anesthesia including but not limited to:  - adverse reactions to medications - damage to eyes, teeth, lips or other oral mucosa - nerve damage due to positioning  - sore throat or hoarseness - Damage to heart, brain, nerves, lungs, other parts of body or loss of life  Patient voiced understanding and assent.)       Anesthesia Quick Evaluation

## 2023-09-23 NOTE — Progress Notes (Signed)
 Patient was able to ambulate, void, and tolerate po intake without difficulty. Instructions given to patient and their spouse.

## 2023-09-23 NOTE — Anesthesia Procedure Notes (Signed)
 Procedure Name: Intubation Date/Time: 09/23/2023 1:20 PM  Performed by: Niki Barter, CRNAPre-anesthesia Checklist: Patient identified, Emergency Drugs available, Suction available and Patient being monitored Patient Re-evaluated:Patient Re-evaluated prior to induction Oxygen Delivery Method: Circle system utilized Preoxygenation: Pre-oxygenation with 100% oxygen Induction Type: IV induction Ventilation: Mask ventilation without difficulty Laryngoscope Size: McGrath and 3 Grade View: Grade I Tube type: Oral Number of attempts: 1 Airway Equipment and Method: Stylet and Oral airway Placement Confirmation: ETT inserted through vocal cords under direct vision, positive ETCO2 and breath sounds checked- equal and bilateral Secured at: 21 cm Tube secured with: Tape Dental Injury: Teeth and Oropharynx as per pre-operative assessment

## 2023-09-23 NOTE — Op Note (Signed)
 Indications: 68 year old man with history of progressive lumbar claudication and lumbar stenosis.  Found to have severe L4-5 central canal stenosis.  Continue to have progressive deficits despite conservative management.  Was offered lumbar decompression  Findings: Severe ligamentum flavum hypertrophy and medial facet hypertrophy causing severe central canal stenosis.  Well decompressed at the end of the procedure  Preoperative Diagnosis: M48.062 Spinal stenosis, lumbar region, with neurogenic claudication R39.15 Urinary urgency Postoperative Diagnosis: M48.062 Spinal stenosis, lumbar region, with neurogenic claudicationR39.15 Urinary urgency   EBL: 20ml IVF: see anesthesia record Drains: none Disposition: Extubated and Stable to PACU Complications: none  No foley catheter was placed.  Procedure: Midline L4/5 Laminectomy, medial facetectomy  Preoperative Note:   Risks of surgery discussed include: infection, bleeding, stroke, coma, death, paralysis, CSF leak, nerve/spinal cord injury, numbness, tingling, weakness, complex regional pain syndrome, recurrent stenosis and/or disc herniation, vascular injury, development of instability, neck/back pain, need for further surgery, persistent symptoms, development of deformity, and the risks of anesthesia. They understood these risks and have agreed to proceed.  Operative Note:    The patient was then brought from the preoperative center with intravenous access established.  The patient underwent general anesthesia and endotracheal tube intubation, then was rotated on the Wilson frame table where all pressure points were appropriately padded.  An incision was marked with flouroscopy. The skin was then thoroughly cleansed.  Perioperative antibiotic prophylaxis was administered.  Sterile prep and drapes were then applied and a timeout was then observed.    Once this was complete a 6 cm incision was opened with the use of a #10 blade knife. The  paraspinus muscles were subperiosteally dissected until the facet was visualized. Flouroscopy was used to confirm the level. A self-retaining retractor was placed.  A Leksell rongeur was utilized to remove the spinous process of L4 and the top of L5.  Using a high speed drill, the  L4/5 laminectomy and medial facet were drilled until ligamentum flavum was visualized bilaterally.  The bony decompression was carried cranially until there is no monitoring at this attachment to the underside of the lamina.  We then widened out the decompression until we could see the ligamentum start to flatten out laterally.  After the laminoforaminotomy was performed, a curette was used to dissect the ligamentum flavum until the epidural fat and dura were visualized. The ligamentum was then carefully removed with 2mm Kerrison punch and rongeurs.  We followed this all the way down to the superior aspect of the L5 lamina.  The superior margin of the L5 lamina was removed along with its ligamentous attachment.  After this had been done centrally the thecal sac was well decompressed.  We then continued to feel out laterally for any residual stenosis and remove this with a Kerrison rongeur.  Once the decompression was finished we brought in fluoroscopy to verify that we had decompressed across areas of stenosis correlated on the MRI.  Once this was satisfactory we then turned our attention to irrigation and closure.  We irrigated copiously.  We obtained meticulous hemostasis.  We placed Depo-Medrol  on the exiting nerve roots.  The fascial layer was reapproximated with the use of a 0- Vicryl suture.  Subcutaneous tissue layer was reapproximated using 2-0 Vicryl suture.  The skin was then cleansed and Dermabond was used to close the skin opening.  Patient was then rotated back to the preoperative bed awakened from anesthesia and taken to recovery all counts are correct in this case.  I performed this  procedure without an assistant  surgeon   Carroll Clamp, MD

## 2023-09-23 NOTE — Transfer of Care (Signed)
 Immediate Anesthesia Transfer of Care Note  Patient: David Clark  Procedure(s) Performed: LUMBAR LAMINECTOMY/DECOMPRESSION MICRODISCECTOMY 1 LEVEL (Spine Lumbar)  Patient Location: PACU  Anesthesia Type:General  Level of Consciousness: awake  Airway & Oxygen Therapy: Patient Spontanous Breathing and Patient connected to face mask oxygen  Post-op Assessment: Report given to RN and Post -op Vital signs reviewed and stable  Post vital signs: Reviewed and stable  Last Vitals:  Vitals Value Taken Time  BP 142/91 09/23/23 14:30  Temp    Pulse 119 09/23/23 14:35  Resp 17 09/23/23 14:35  SpO2 100 % 09/23/23 14:35  Vitals shown include unfiled device data.  Last Pain:  Vitals:   09/23/23 1219  TempSrc: Temporal  PainSc: 9          Complications: No notable events documented.

## 2023-09-24 ENCOUNTER — Encounter: Payer: Self-pay | Admitting: Neurosurgery

## 2023-09-24 NOTE — Anesthesia Postprocedure Evaluation (Signed)
 Anesthesia Post Note  Patient: Evetta Hoehn  Procedure(s) Performed: LUMBAR LAMINECTOMY/DECOMPRESSION MICRODISCECTOMY 1 LEVEL (Spine Lumbar)  Patient location during evaluation: PACU Anesthesia Type: General Level of consciousness: awake and alert Pain management: pain level controlled Vital Signs Assessment: post-procedure vital signs reviewed and stable Respiratory status: spontaneous breathing, nonlabored ventilation, respiratory function stable and patient connected to nasal cannula oxygen Cardiovascular status: blood pressure returned to baseline and stable Postop Assessment: no apparent nausea or vomiting Anesthetic complications: no   No notable events documented.   Last Vitals:  Vitals:   09/23/23 1500 09/23/23 1522  BP: (!) 140/95 (!) 144/92  Pulse: (!) 105 99  Resp: 13   Temp:  (!) 36.3 C  SpO2: 97% 96%    Last Pain:  Vitals:   09/23/23 1522  TempSrc: Tympanic  PainSc: 0-No pain                 Nancey Awkward

## 2023-09-29 ENCOUNTER — Other Ambulatory Visit: Payer: Self-pay | Admitting: Neurosurgery

## 2023-09-29 MED ORDER — OXYCODONE HCL 5 MG PO TABS: 5.0000 mg | ORAL_TABLET | ORAL | 0 refills | Status: DC | PRN

## 2023-10-06 ENCOUNTER — Encounter: Payer: Self-pay | Admitting: Physician Assistant

## 2023-10-06 ENCOUNTER — Ambulatory Visit: Admitting: Physician Assistant

## 2023-10-06 VITALS — BP 122/70 | Temp 97.7°F | Ht 72.0 in | Wt 185.0 lb

## 2023-10-06 DIAGNOSIS — M48062 Spinal stenosis, lumbar region with neurogenic claudication: Secondary | ICD-10-CM

## 2023-10-06 DIAGNOSIS — I10 Essential (primary) hypertension: Secondary | ICD-10-CM | POA: Diagnosis not present

## 2023-10-06 DIAGNOSIS — H5203 Hypermetropia, bilateral: Secondary | ICD-10-CM | POA: Diagnosis not present

## 2023-10-06 DIAGNOSIS — H353131 Nonexudative age-related macular degeneration, bilateral, early dry stage: Secondary | ICD-10-CM | POA: Diagnosis not present

## 2023-10-06 DIAGNOSIS — H2513 Age-related nuclear cataract, bilateral: Secondary | ICD-10-CM | POA: Diagnosis not present

## 2023-10-06 DIAGNOSIS — Z9889 Other specified postprocedural states: Secondary | ICD-10-CM

## 2023-10-06 MED ORDER — CELECOXIB 100 MG PO CAPS: 100.0000 mg | ORAL_CAPSULE | Freq: Two times a day (BID) | ORAL | 0 refills | Status: AC

## 2023-10-06 MED ORDER — OXYCODONE HCL 5 MG PO TABS: 5.0000 mg | ORAL_TABLET | ORAL | 0 refills | Status: AC | PRN

## 2023-10-06 NOTE — Progress Notes (Signed)
   REFERRING PHYSICIAN:  Cleotilde Oneil [place], Md [address] [facility] [place],  [place] [address]  DOS: 09/23/23, L4-5 laminectomy  HISTORY OF PRESENT ILLNESS: David Clark is approximately 2 weeks status post L4-5 laminectomy. he is doing okay.  He is still experiencing quite a bit of pain despite the oxycodone .  No pain on his legs, is on palpation in his back as expected.  No new weakness, numbness or tingling.     PHYSICAL EXAMINATION:  General: Patient is well developed, well nourished, calm, collected, and in no apparent distress.   NEUROLOGICAL:  General: In no acute distress.   Awake, alert, oriented to person, place, and time.  Pupils equal round and reactive to light.  Facial tone is symmetric.     Strength:            Side Iliopsoas Quads Hamstring PF DF EHL  R 5 5 5 5 5 5   L 5 5 5 5 5 5    Incision c/d/i   ROS (Neurologic):  Negative except as noted above  IMAGING: No new imaging.   ASSESSMENT/PLAN:  David Clark is doing well approximately 2 weeks status post L4-5 laminectomy.  He does have quite a bit of pain.  Plan to follow-up in approximately 1 month for 6-week postop visit.I have advised the patient to lift up to 10 pounds until 6 weeks after surgery, then increase up to 25 pounds until 12 weeks after surgery.  After 12 weeks post-op, the patient advised to increase activity as tolerated.   Advised patient to add in Tylenol  to his pain regimen at his house milligrams 3 times a day.  Celebrex was added and refill was sent for oxycodone .  Continue Robaxin , may cut in half as needed.  Advised to contact the office if any questions or concerns arise.  Lyle Decamp PA-C Department of neurosurgery

## 2023-10-22 ENCOUNTER — Ambulatory Visit
Admission: RE | Admit: 2023-10-22 | Discharge: 2023-10-22 | Disposition: A | Discharge status: Home / Self Care | Source: Ambulatory Visit | Attending: Oncology | Admitting: Oncology

## 2023-10-22 DIAGNOSIS — C3411 Malignant neoplasm of upper lobe, right bronchus or lung: Secondary | ICD-10-CM | POA: Insufficient documentation

## 2023-10-22 DIAGNOSIS — C3491 Malignant neoplasm of unspecified part of right bronchus or lung: Secondary | ICD-10-CM | POA: Insufficient documentation

## 2023-10-22 DIAGNOSIS — I700 Atherosclerosis of aorta: Secondary | ICD-10-CM | POA: Diagnosis not present

## 2023-10-22 DIAGNOSIS — J439 Emphysema, unspecified: Secondary | ICD-10-CM | POA: Diagnosis not present

## 2023-10-22 MED ORDER — IOHEXOL 300 MG/ML  SOLN
75.0000 mL | Freq: Once | INTRAMUSCULAR | Status: AC | PRN
  Administered 2023-10-22: 75 mL via INTRAVENOUS

## 2023-10-29 ENCOUNTER — Ambulatory Visit: Admitting: Oncology

## 2023-11-01 ENCOUNTER — Ambulatory Visit: Admitting: Oncology

## 2023-11-02 ENCOUNTER — Ambulatory Visit: Admitting: Oncology

## 2023-11-03 ENCOUNTER — Ambulatory Visit
Admission: RE | Admit: 2023-11-03 | Discharge: 2023-11-03 | Disposition: A | Discharge status: Home / Self Care | Source: Ambulatory Visit | Attending: Neurosurgery | Admitting: Neurosurgery

## 2023-11-03 ENCOUNTER — Ambulatory Visit
Admission: RE | Admit: 2023-11-03 | Discharge: 2023-11-03 | Disposition: A | Discharge status: Home / Self Care | Attending: Neurosurgery | Admitting: Neurosurgery

## 2023-11-03 ENCOUNTER — Encounter: Payer: Self-pay | Admitting: Neurosurgery

## 2023-11-03 ENCOUNTER — Ambulatory Visit (INDEPENDENT_AMBULATORY_CARE_PROVIDER_SITE_OTHER): Admitting: Neurosurgery

## 2023-11-03 VITALS — BP 118/84 | Temp 98.1°F | Ht 72.0 in | Wt 185.0 lb

## 2023-11-03 DIAGNOSIS — I700 Atherosclerosis of aorta: Secondary | ICD-10-CM | POA: Diagnosis not present

## 2023-11-03 DIAGNOSIS — M48062 Spinal stenosis, lumbar region with neurogenic claudication: Secondary | ICD-10-CM

## 2023-11-03 DIAGNOSIS — M4316 Spondylolisthesis, lumbar region: Secondary | ICD-10-CM | POA: Diagnosis not present

## 2023-11-03 DIAGNOSIS — Z09 Encounter for follow-up examination after completed treatment for conditions other than malignant neoplasm: Secondary | ICD-10-CM

## 2023-11-03 DIAGNOSIS — M5126 Other intervertebral disc displacement, lumbar region: Secondary | ICD-10-CM | POA: Diagnosis not present

## 2023-11-03 NOTE — Progress Notes (Deleted)
   REFERRING PHYSICIAN:  Cleotilde Oneil FALCON, Md [address] [facility] [place],  [place] [address]  DOS: 09/23/23, L4-5 laminectomy  HISTORY OF PRESENT ILLNESS: David Clark is approximately 2 weeks status post L4-5 laminectomy. he is doing okay.  He is still experiencing quite a bit of pain despite the oxycodone .  No pain on his legs, is on palpation in his back as expected.  No new weakness, numbness or tingling.     PHYSICAL EXAMINATION:  General: Patient is well developed, well nourished, calm, collected, and in no apparent distress.   NEUROLOGICAL:  General: In no acute distress.   Awake, alert, oriented to person, place, and time.  Pupils equal round and reactive to light.  Facial tone is symmetric.     Strength:            Side Iliopsoas Quads Hamstring PF DF EHL  R 5 5 5 5 5 5   L 5 5 5 5 5 5    Incision c/d/i   ROS (Neurologic):  Negative except as noted above  IMAGING: No new imaging.   ASSESSMENT/PLAN:  TUCK DULWORTH is doing well approximately 2 weeks status post L4-5 laminectomy.  He does have quite a bit of pain.  Plan to follow-up in approximately 1 month for 6-week postop visit.I have advised the patient to lift up to 10 pounds until 6 weeks after surgery, then increase up to 25 pounds until 12 weeks after surgery.  After 12 weeks post-op, the patient advised to increase activity as tolerated.   Mostly bothersome when he gets up to walk and move. Sitting is okay. Will get xrays.     Advised patient to add in Tylenol  to his pain regimen at his house milligrams 3 times a day.  Celebrex  was added and refill was sent for oxycodone .  Continue Robaxin , may cut in half as needed.  Advised to contact the office if any questions or concerns arise.  Lyle Decamp PA-C Department of neurosurgery

## 2023-11-03 NOTE — Progress Notes (Signed)
   REFERRING PHYSICIAN:  Cleotilde Oneil FALCON, Md [address] [facility] [place],  [place] [address]  DOS: 09/23/23, L4-5 laminectomy  HISTORY OF PRESENT ILLNESS: David Clark is approximately 6 weeks status post L4-5 laminectomy.  He is not having any lower extremity radiating pain anymore.  He does have back pain and bilateral hip pain that is worse while he standing.  He is not having any new bowel or bladder issues.  No wound healing issues.    PHYSICAL EXAMINATION:  General: Patient is well developed, well nourished, calm, collected, and in no apparent distress.   NEUROLOGICAL:  General: In no acute distress.   Awake, alert, oriented to person, place, and time.  Pupils equal round and reactive to light.  Facial tone is symmetric.     Strength:            Side Iliopsoas Quads Hamstring PF DF EHL  R 5 5 5 5 5 5   L 5 5 5 5 5 5    Incision c/d/i  Gait is abnormal with shuffling gait.  ROS (Neurologic):  Negative except as noted above  IMAGING: No new imaging.   ASSESSMENT/PLAN:  David Clark is doing well approximately 6 weeks status post L4-5 laminectomy.  His lower extremity symptoms have improved but he does have back pain that radiates in the bilateral hips.  At this point he can increase his lifting restriction to 25 pounds.  He did have a slight slip prior to surgery so I like to get flexion-extension x-rays to evaluate for any worsening spondylolisthesis.  We discussed that this was a risk given his minor slip before surgery.  I discussed with him that it would be good to start with physical therapy however he wants to hold off on this at this time.  I let him know he could reach out to us  if he changes his mind.  I like to follow-up with him at his scheduled follow-up visit, like to see whether or not he has improvement or worsening with time.  He is still early in recovery.  If he calls back and request physical therapy will be happy  to place this order.   Penne MICAEL Sharps, MD Department of neurosurgery

## 2023-11-10 ENCOUNTER — Ambulatory Visit: Admitting: Oncology

## 2023-11-11 ENCOUNTER — Ambulatory Visit: Payer: Self-pay | Admitting: Neurosurgery

## 2023-11-16 ENCOUNTER — Encounter: Payer: Self-pay | Admitting: Oncology

## 2023-11-16 ENCOUNTER — Inpatient Hospital Stay: Attending: Oncology | Admitting: Oncology

## 2023-11-16 VITALS — BP 124/84 | HR 105 | Temp 97.3°F | Resp 16 | Wt 173.5 lb

## 2023-11-16 DIAGNOSIS — C3411 Malignant neoplasm of upper lobe, right bronchus or lung: Secondary | ICD-10-CM | POA: Diagnosis not present

## 2023-11-16 DIAGNOSIS — G8929 Other chronic pain: Secondary | ICD-10-CM | POA: Diagnosis not present

## 2023-11-16 DIAGNOSIS — F17290 Nicotine dependence, other tobacco product, uncomplicated: Secondary | ICD-10-CM | POA: Insufficient documentation

## 2023-11-16 NOTE — Progress Notes (Signed)
 [facility]  [phone] [phone]) [phone]  ID: Sheena JONELLE Seen OB: [birth year]  MR#: [id number]  RDW#:[id number]  Patient Care Team: Cleotilde Oneil FALCON, MD as PCP - General (Internal Medicine) Verdene Gills, RN as Oncology Nurse Navigator [name], Evalene PARAS, MD as Consulting Physician (Oncology)   CHIEF COMPLAINT: Stage IA2 adenocarcinoma of the right upper lobe lung.    INTERVAL HISTORY: Patient returns to clinic today for further evaluation and discussion of his CT scan results.  He recently underwent back surgery and continues to have significant pain and fatigue related to this, but otherwise feels well.  He has no neurologic complaints.  He denies any recent fevers.  He denies any chest pain, shortness of breath, cough, or hemoptysis.  He denies any nausea, vomiting, constipation, or diarrhea.  He has no urinary complaints.  Patient offers no further specific complaints today.  REVIEW OF SYSTEMS:   Review of Systems  Constitutional:  Positive for malaise/fatigue. Negative for fever and weight loss.  Respiratory: Negative.  Negative for cough, hemoptysis and shortness of breath.   Cardiovascular: Negative.  Negative for chest pain and leg swelling.  Gastrointestinal: Negative.  Negative for abdominal pain.  Genitourinary: Negative.  Negative for frequency.  Musculoskeletal:  Positive for back pain.  Skin: Negative.  Negative for rash.  Neurological: Negative.  Negative for dizziness, focal weakness, weakness and headaches.  Psychiatric/Behavioral: Negative.  The patient is not nervous/anxious.     As per HPI. Otherwise, a complete review of systems is negative.  PAST MEDICAL HISTORY: Past Medical History:  Diagnosis Date   Adenocarcinoma of upper lobe of right lung (HCC)    Alcohol abuse    Arthritis    BPH (benign prostatic hyperplasia)    Cervical disc disease    COPD (chronic obstructive pulmonary disease) (HCC)    COVID    Family history  of adverse reaction to anesthesia    dad-delayed emergence   GERD (gastroesophageal reflux disease)    Gout    Hepatitis B 1993   History of cocaine abuse (HCC)    in the 1980's   HLD (hyperlipidemia)    Hypertension    Hypomagnesemia    Hyponatremia    Lumbar spinal stenosis    Mucus plugging of bronchi    Pneumonia    Pulmonary nodule    Sepsis (HCC)    Sinus tachycardia    Squamous cell carcinoma, arm, left    Tobacco abuse     PAST SURGICAL HISTORY: Past Surgical History:  Procedure Laterality Date   ANTERIOR CERVICAL DECOMP/DISCECTOMY FUSION N/A 01/01/2021   Procedure: C5-6 ANTERIOR CERVICAL DECOMPRESSION/DISCECTOMY FUSION 1 LEVEL;  Surgeon: Clois Fret, MD;  Location: [facility] ORS;  Service: Neurosurgery;  Laterality: N/A;   Excision skin lesion- skin cancer     LUMBAR LAMINECTOMY/DECOMPRESSION MICRODISCECTOMY N/A 09/23/2023   Procedure: LUMBAR LAMINECTOMY/DECOMPRESSION MICRODISCECTOMY 1 LEVEL;  Surgeon: Claudene Penne ORN, MD;  Location: [facility] ORS;  Service: Neurosurgery;  Laterality: N/A;  L4-5 LAMINECTOMY   VIDEO BRONCHOSCOPY WITH ENDOBRONCHIAL ULTRASOUND N/A 07/24/2022   Procedure: VIDEO BRONCHOSCOPY WITH ENDOBRONCHIAL ULTRASOUND;  Surgeon: Parris Manna, MD;  Location: [facility] ORS;  Service: Thoracic;  Laterality: N/A;    FAMILY HISTORY: Family History  Problem Relation Age of Onset   Diabetes type II Mother    COPD Mother    Heart attack Mother    Heart attack Father    Dementia Father    Prostate cancer Father    Heart disease Sister  ADVANCED DIRECTIVES (Y/N):  N  HEALTH MAINTENANCE: Social History   Tobacco Use   Smoking status: Some Days    Types: Cigars   Smokeless tobacco: Current    Types: Snuff  Vaping Use   Vaping status: Never Used  Substance Use Topics   Alcohol use: Yes    Alcohol/week: 42.0 standard drinks of alcohol    Types: 42 Cans of beer per week    Comment: 6 pk day   Drug use: Not Currently    Types: Cocaine    Comment:  IV use 1980s     Colonoscopy:  PAP:  Bone density:  Lipid panel:  No Known Allergies  Current Outpatient Medications  Medication Sig Dispense Refill   amLODipine  (NORVASC ) 5 MG tablet Take 5 mg by mouth every morning.     celecoxib  (CELEBREX ) 100 MG capsule Take 1 capsule (100 mg total) by mouth 2 (two) times daily. 60 capsule 0   Fluticasone-Umeclidin-Vilant (TRELEGY ELLIPTA) 100-62.5-25 MCG/ACT AEPB Inhale 1 puff into the lungs daily as needed (shortness of breath).     methocarbamol  (ROBAXIN ) 500 MG tablet Take 1 tablet (500 mg total) by mouth every 6 (six) hours as needed for muscle spasms. 120 tablet 0   omeprazole (PRILOSEC) 20 MG capsule Take 20 mg by mouth every morning.     senna (SENOKOT) 8.6 MG TABS tablet Take 1 tablet (8.6 mg total) by mouth 2 (two) times daily as needed. 30 tablet 0   No current facility-administered medications for this visit.    OBJECTIVE: Vitals:   11/16/23 1000  BP: 124/84  Pulse: (!) 105  Resp: 16  Temp: (!) 97.3 F (36.3 C)      Body mass index is 23.53 kg/m.    ECOG FS:1 - Symptomatic but completely ambulatory  General: Well-developed, well-nourished, no acute distress. Eyes: Pink conjunctiva, anicteric sclera. HEENT: Normocephalic, moist mucous membranes. Lungs: No audible wheezing or coughing. Heart: Regular rate and rhythm. Abdomen: Soft, nontender, no obvious distention. Musculoskeletal: No edema, cyanosis, or clubbing. Neuro: Alert, answering all questions appropriately. Cranial nerves grossly intact. Skin: No rashes or petechiae noted. Psych: Normal affect.  LAB RESULTS:  Lab Results  Component Value Date   NA 136 01/05/2023   K 4.7 01/05/2023   CL 100 01/05/2023   CO2 26 01/05/2023   GLUCOSE 112 (H) 01/05/2023   BUN 9 01/05/2023   CREATININE 0.80 01/05/2023   CALCIUM 9.4 01/05/2023   PROT 8.1 01/05/2023   ALBUMIN 4.2 01/05/2023   AST 40 01/05/2023   ALT 31 01/05/2023   ALKPHOS 78 01/05/2023   BILITOT 1.0  01/05/2023   GFRNONAA >60 01/05/2023    Lab Results  Component Value Date   WBC 5.6 01/05/2023   NEUTROABS 3.1 01/05/2023   HGB 15.0 01/05/2023   HCT 40.0 01/05/2023   MCV 100.0 01/05/2023   PLT 199 01/05/2023     STUDIES: DG Lumbar Spine Complete Result Date: 11/11/2023 CLINICAL DATA:  Evaluate for excess motion EXAM: LUMBAR SPINE - COMPLETE 4+ VIEW COMPARISON:  X-ray lumbar spine 09/23/2023 FINDINGS: There is no evidence of lumbar spine fracture. Mild retrolisthesis of L2 on L3 and L3 on L4. Grade 1 anterolisthesis of L4 on L5. No change in alignment on flexion extension views. Intervertebral disc spaces are maintained. Vascular calcifications. IMPRESSION: 1. No acute displaced fracture or traumatic listhesis of the lumbar spine. 2. Mild retrolisthesis of L2 on L3 and L3 on L4. Grade 1 anterolisthesis of L4 on L5. No change in  alignment on flexion extension views. 3.  Aortic Atherosclerosis (ICD10-I70.0). Electronically Signed   By: Morgane  Naveau M.D.   On: 11/11/2023 03:50   CT CHEST W CONTRAST Result Date: 10/23/2023 CLINICAL DATA:  Follow-up malignant neoplasm of right upper lobe. * Tracking Code: BO * EXAM: CT CHEST WITH CONTRAST TECHNIQUE: Multidetector CT imaging of the chest was performed during intravenous contrast administration. RADIATION DOSE REDUCTION: This exam was performed according to the departmental dose-optimization program which includes automated exposure control, adjustment of the mA and/or kV according to patient size and/or use of iterative reconstruction technique. CONTRAST:  75mL OMNIPAQUE  IOHEXOL  300 MG/ML  SOLN COMPARISON:  Multiple priors including CT July 16 2023 FINDINGS: Cardiovascular: Normal caliber thoracic aorta. Normal size heart. Coronary artery calcifications. Aortic atherosclerosis. No significant pericardial effusion/thickening. Mediastinum/Nodes: No suspicious thyroid  nodule. No pathologically enlarged mediastinal, hilar or axillary lymph nodes. Mild  symmetric esophageal wall thickening. Lungs/Pleura: Right upper lobe irregular nodular consolidation measures 2.0 x 1.7 cm on image 50/4 previously 2.2 x 1.7 cm. There is similar confluent bands extending from the lesion to the pleura with some interposed consolidation also unchanged. Previously indexed nodular component medial to the above lesion is stable measuring 10 x 9 mm on image 58/4 previously 11 x 10 mm. No new suspicious nodularity in the treatment area. Progressive subpleural reticulations in the lateral and posterior right lung. Similar bilateral anterior subpleural reticulations. Mild emphysema. New 3 mm pulmonary nodule medial right lower lobe on image 80/4. Stable 3 mm left upper lobe pulmonary nodule on image 48/4. Upper Abdomen: No acute abnormality. Musculoskeletal: No aggressive lytic or blastic lesion of bone. Multilevel degenerative changes spine. IMPRESSION: 1. Similar size of the right upper lobe irregular nodular consolidation and the medial nodular component with similar confluent bands extending from the lesion to the pleura with some interposed consolidation. No new suspicious nodularity in the treatment area. 2. Progressive subpleural reticulations in the lateral and posterior right lung, nonspecific but favored to reflect posttreatment change. 3. Mild symmetric esophageal wall thickening, suggestive of esophagitis. 4. Aortic Atherosclerosis (ICD10-I70.0) and Emphysema (ICD10-J43.9). Electronically Signed   By: Reyes Holder M.D.   On: 10/23/2023 17:37    ASSESSMENT: Stage IA2 adenocarcinoma of the right upper lobe lung.  PLAN:    Stage IA2 adenocarcinoma of the right upper lobe lung: Diagnosis confirmed by bronchoscopy on July 24, 2022.  PET scan results from July 08, 2022 reviewed independently with no obvious evidence of disease outside his known pulmonary nodule.  It was determined patient was not a surgical candidate and he proceeded directly with XRT completing in June  2024.  His most recent CT scan on October 23, 2023 reviewed independently and reported as above with mildly decrease in size of right upper lobe irregular nodular consolidation.  He has several other stable pulmonary nodules and a new 3 mm pulmonary nodule in the right lower lobe.  No further interventions are needed.  Return to clinic in 6 months with repeat imaging and further evaluation.    Shortness of breath/cough: Patient does not complain of this today.  Secondary to underlying COPD.  Continue follow-up with pulmonary as scheduled.   Back pain: Chronic and unchanged.  Unrelated to malignancy.    Patient expressed understanding and was in agreement with this plan. He also understands that He can call clinic at any time with any questions, concerns, or complaints.    Cancer Staging  Adenocarcinoma, lung, right (HCC) Staging form: Lung, AJCC 8th Edition - Clinical  stage from 07/30/2022: Stage IA2 (cT1b, cN0, cM0) - Signed by [name] Evalene PARAS, MD on 07/30/2022 Stage prefix: Initial diagnosis   Evalene PARAS [name], MD   11/17/2023 6:24 AM

## 2023-11-17 ENCOUNTER — Telehealth (INDEPENDENT_AMBULATORY_CARE_PROVIDER_SITE_OTHER): Payer: Self-pay

## 2023-11-17 DIAGNOSIS — M25552 Pain in left hip: Secondary | ICD-10-CM

## 2023-11-17 DIAGNOSIS — M549 Dorsalgia, unspecified: Secondary | ICD-10-CM

## 2023-11-17 DIAGNOSIS — M25551 Pain in right hip: Secondary | ICD-10-CM

## 2023-11-17 DIAGNOSIS — Z9889 Other specified postprocedural states: Secondary | ICD-10-CM

## 2023-11-17 NOTE — Telephone Encounter (Signed)
 [place] to state he is very disappointed in the care he is receiving, and that he doesn't have much faith in Dr. Claudene. He states no one has called him about his recent xray results, that his surgery did me no good, that he still can't walk, and that he does not want to do PT because it won't help. Patient asked for sooner appt than scheduled 12/13/23.  Rescheduled OV to 11/29/23. Reviewed xray results with patient, and advised message was sent to MyChart. He states he cannot access MyChart - he agreed to deactivation since he cannot access app.   FYI - thanks!

## 2023-11-18 DIAGNOSIS — M549 Dorsalgia, unspecified: Secondary | ICD-10-CM | POA: Insufficient documentation

## 2023-11-18 DIAGNOSIS — M25551 Pain in right hip: Secondary | ICD-10-CM | POA: Insufficient documentation

## 2023-11-18 DIAGNOSIS — Z9889 Other specified postprocedural states: Secondary | ICD-10-CM | POA: Insufficient documentation

## 2023-11-18 NOTE — Telephone Encounter (Signed)
 I was able to reach the patient by telephone this morning.  We discussed his ongoing symptoms.  He does state that the sciatic pain radiating down his leg has completely resolved, however he is having some significant back/hip pain.  He states that this has been quite severe.  Is not having any new deficits or no new neurologic issues.  We also discussed the issue with result relayed to him.  Unfortunately he was unable to access his active MyChart account and did not see that we had sent him communications immediately after his results were released.  He stated his understanding, he was able to deactivate his MyChart account so that he would be set up for calls regarding his results rather than messages.  We also moved up his appointment to 8/25 so we could expedite a workup.  Will plan on getting x-rays of his hips, we did flexion-extension x-rays of his back at his last appointment which did not show any instability.  Curious whether or not he has developed hip pathology.  Will evaluate further for this and then likely refer for hip or facet injections.  His questions were answered to his approval.

## 2023-11-29 ENCOUNTER — Ambulatory Visit (INDEPENDENT_AMBULATORY_CARE_PROVIDER_SITE_OTHER): Admitting: Physician Assistant

## 2023-11-29 ENCOUNTER — Encounter: Payer: Self-pay | Admitting: Physician Assistant

## 2023-11-29 ENCOUNTER — Ambulatory Visit
Admission: RE | Admit: 2023-11-29 | Discharge: 2023-11-29 | Disposition: A | Discharge status: Home / Self Care | Attending: Physician Assistant | Admitting: Physician Assistant

## 2023-11-29 ENCOUNTER — Ambulatory Visit
Admission: RE | Admit: 2023-11-29 | Discharge: 2023-11-29 | Disposition: A | Discharge status: Home / Self Care | Source: Ambulatory Visit | Attending: Physician Assistant | Admitting: Physician Assistant

## 2023-11-29 VITALS — BP 108/72 | Ht 72.0 in | Wt 173.0 lb

## 2023-11-29 DIAGNOSIS — Z981 Arthrodesis status: Secondary | ICD-10-CM | POA: Diagnosis not present

## 2023-11-29 DIAGNOSIS — M25551 Pain in right hip: Secondary | ICD-10-CM | POA: Insufficient documentation

## 2023-11-29 DIAGNOSIS — M546 Pain in thoracic spine: Secondary | ICD-10-CM

## 2023-11-29 DIAGNOSIS — M160 Bilateral primary osteoarthritis of hip: Secondary | ICD-10-CM | POA: Diagnosis not present

## 2023-11-29 DIAGNOSIS — M25552 Pain in left hip: Secondary | ICD-10-CM | POA: Diagnosis not present

## 2023-11-29 DIAGNOSIS — R2681 Unsteadiness on feet: Secondary | ICD-10-CM

## 2023-11-29 DIAGNOSIS — M48062 Spinal stenosis, lumbar region with neurogenic claudication: Secondary | ICD-10-CM

## 2023-11-29 MED ORDER — TIZANIDINE HCL 4 MG PO TABS: 4.0000 mg | ORAL_TABLET | Freq: Three times a day (TID) | ORAL | 1 refills | Status: DC | PRN

## 2023-11-29 MED ORDER — TRAMADOL HCL 50 MG PO TABS: 50.0000 mg | ORAL_TABLET | Freq: Four times a day (QID) | ORAL | 0 refills | Status: DC | PRN

## 2023-11-29 NOTE — Progress Notes (Signed)
   REFERRING PHYSICIAN:  Cleotilde Oneil FALCON, Md [address] [facility] [place],  [place] [address]  DOS: 09/23/23, L4-5 laminectomy  HISTORY OF PRESENT ILLNESS: David Clark is approximately 10 weeks status post L4-5 laminectomy.  He does not have any lower extremity radiating pain.  His main complaint today is back pain and left lateral hip pain that is worse when he is standing.  He feels as though when he walks he is very off balance and has started to stumble and fall.   PHYSICAL EXAMINATION:  General: Patient is well developed, well nourished, calm, collected, and in no apparent distress.   NEUROLOGICAL:  General: In no acute distress.   Awake, alert, oriented to person, place, and time.  Pupils equal round and reactive to light.  Facial tone is symmetric.     Strength: Normal in bilateral upper extremities, normal left wrist extension weakness.  Positive Hoffmann sign.  He has pain with internal rotation of bilateral hips that is significant.    L WE =weakness,  + hoffman sign   Pain with internal rotation of the hip            Side Iliopsoas Quads Hamstring PF DF EHL  R 5 5 5 5 5 5   L 5 5 5 5 5 5    Incision c/d/i  Gait is abnormal with shuffling gait, narrow stance   ROS (Neurologic):  Negative except as noted above  IMAGING: EXAM: LUMBAR SPINE - COMPLETE 4+ VIEW   COMPARISON:  X-ray lumbar spine 09/23/2023   FINDINGS: There is no evidence of lumbar spine fracture. Mild retrolisthesis of L2 on L3 and L3 on L4. Grade 1 anterolisthesis of L4 on L5. No change in alignment on flexion extension views. Intervertebral disc spaces are maintained. Vascular calcifications.   IMPRESSION: 1. No acute displaced fracture or traumatic listhesis of the lumbar spine. 2. Mild retrolisthesis of L2 on L3 and L3 on L4. Grade 1 anterolisthesis of L4 on L5. No change in alignment on flexion extension views. 3.  Aortic Atherosclerosis  (ICD10-I70.0).  ASSESSMENT/PLAN:  David Clark is approximately 10 weeks status post L4-5 laminectomy.  He does not have any lower extremity radiating pain.  His main complaint today is back pain and left lateral hip pain that is worse when he is standing.  He feels as though when he walks he is very off balance and has started to stumble and fall.  X-rays completed this visit did show a mild retrolisthesis of L2 on L3 and L3 on L4 and grade 1 anterolisthesis of L4 on L5.  Previous surgical history of his cervical spine and with potential myelopathic symptoms, would like him to undergo updated imaging of his cervical and thoracic spine.  Will review results once completed.  Will also have patient get x-rays today while in clinic.  Currently in significant pain.  Muscle relaxer changed to tizanidine .  Side effects of this medication were discussed at length.  Tramadol  given for short-term pain relief.  Due to significant pain with internal rotation of hip, pending results would consider orthopedic surgery referral.  Patient like to hold off on physical therapy at this time we will continue home exercises.  Lyle Decamp, PA-C Department of neurosurgery

## 2023-12-07 ENCOUNTER — Ambulatory Visit
Admission: RE | Admit: 2023-12-07 | Discharge: 2023-12-07 | Disposition: A | Discharge status: Home / Self Care | Source: Ambulatory Visit | Attending: Physician Assistant | Admitting: Physician Assistant

## 2023-12-07 DIAGNOSIS — R2681 Unsteadiness on feet: Secondary | ICD-10-CM

## 2023-12-07 DIAGNOSIS — M546 Pain in thoracic spine: Secondary | ICD-10-CM

## 2023-12-07 DIAGNOSIS — Z981 Arthrodesis status: Secondary | ICD-10-CM

## 2023-12-09 ENCOUNTER — Other Ambulatory Visit

## 2023-12-13 ENCOUNTER — Encounter: Admitting: Physician Assistant

## 2023-12-29 ENCOUNTER — Telehealth: Payer: Self-pay | Admitting: Physician Assistant

## 2023-12-29 NOTE — Telephone Encounter (Signed)
 Patient is calling to find out why he needs to have an MRI of his cervical spine? He states that his lower back is causing him pain not his neck. He would like to know what the next step in his treatment plan is and why he is still having pain after surgery.

## 2023-12-29 NOTE — Telephone Encounter (Signed)
 I spoke to patient and informed him per Annitta note he needs updated images for cervical and thoracic spine since he has had surgery in the past. He images are 68 years old. Patient voiced understanding.

## 2024-01-06 ENCOUNTER — Ambulatory Visit
Admission: RE | Admit: 2024-01-06 | Discharge: 2024-01-06 | Disposition: A | Discharge status: Home / Self Care | Source: Ambulatory Visit | Attending: Physician Assistant | Admitting: Physician Assistant

## 2024-01-06 ENCOUNTER — Ambulatory Visit
Admission: RE | Admit: 2024-01-06 | Discharge: 2024-01-06 | Disposition: A | Source: Ambulatory Visit | Attending: Physician Assistant | Admitting: Physician Assistant

## 2024-01-06 DIAGNOSIS — M47812 Spondylosis without myelopathy or radiculopathy, cervical region: Secondary | ICD-10-CM | POA: Diagnosis not present

## 2024-01-06 DIAGNOSIS — M50223 Other cervical disc displacement at C6-C7 level: Secondary | ICD-10-CM | POA: Diagnosis not present

## 2024-01-06 DIAGNOSIS — M5124 Other intervertebral disc displacement, thoracic region: Secondary | ICD-10-CM | POA: Diagnosis not present

## 2024-01-06 DIAGNOSIS — M4802 Spinal stenosis, cervical region: Secondary | ICD-10-CM | POA: Diagnosis not present

## 2024-01-12 ENCOUNTER — Ambulatory Visit: Payer: Self-pay | Admitting: Physician Assistant

## 2024-01-12 ENCOUNTER — Telehealth: Payer: Self-pay | Admitting: Physician Assistant

## 2024-01-12 NOTE — Telephone Encounter (Signed)
 Pt calling for update.

## 2024-01-12 NOTE — Telephone Encounter (Signed)
 Patient is calling to follow up on his imaging results and what the next step is in his treatment plan. Please advise.

## 2024-01-13 NOTE — Telephone Encounter (Signed)
 Patient scheduled on 01/25/2024.

## 2024-01-18 ENCOUNTER — Telehealth: Payer: Self-pay | Admitting: Neurosurgery

## 2024-01-18 NOTE — Progress Notes (Signed)
 Referring Physician:  Cleotilde Oneil FALCON, MD [address] [facility] [place],  [place] [address]  Primary Physician:  Cleotilde Oneil FALCON, MD  History of Present Illness: 01/25/2024 [name] is here today with a chief complaint of back pain and prior spinal surgery who presents with difficulty walking and severe back pain. He was referred by Lyle Decamp for evaluation of balance issues and back pain. He had a R L4/5 laminoforaminotomy in June 2025 by my partner Penne sharps.  He experiences significant difficulty walking, requiring support from walls and can only walk about two feet before stopping. This has persisted for over a year. He attributes his balance problems to severe lower back pain, located around the belt line, which worsens with standing and walking. There is no radiation of pain to his legs. Spinal surgery in June provided only temporary relief, and past injections were ineffective. He has not participated in recent physical therapy.  He reports a significant weight loss of approximately 50 pounds over the past six to eight months, which he attributes to a lack of appetite due to pain. He has a history of lung cancer with regular follow-ups.  There is no numbness or tingling in his hands, but he has difficulty with fine motor tasks, such as opening jars, due to perceived loss of strength. He also notes significant swelling in his feet over the past six months.  His current medications include Excedrin and ibuprofen , often taken in large quantities, and Kratom, though he is unsure of its effects.   Sheena JONELLE Seen has some symptoms of cervical myelopathy.  The symptoms are causing a significant impact on the patient's life.   I have utilized the care everywhere function in epic to review the outside records available from external health systems.  Note from 11/29/2023 Lyle Decamp, [place]  [name] is approximately 10 weeks status  post L4-5 laminectomy.  He does not have any lower extremity radiating pain.  His main complaint today is back pain and left lateral hip pain that is worse when he is standing.  He feels as though when he walks he is very off balance and has started to stumble and fall.   Review of Systems:  A 10 point review of systems is negative, except for the pertinent positives and negatives detailed in the HPI.  Past Medical History: Past Medical History:  Diagnosis Date   Adenocarcinoma of upper lobe of right lung (HCC)    Alcohol abuse    Arthritis    BPH (benign prostatic hyperplasia)    Cervical disc disease    COPD (chronic obstructive pulmonary disease) (HCC)    COVID    Family history of adverse reaction to anesthesia    dad-delayed emergence   GERD (gastroesophageal reflux disease)    Gout    Hepatitis B 1993   History of cocaine abuse (HCC)    in the 1980's   HLD (hyperlipidemia)    Hypertension    Hypomagnesemia    Hyponatremia    Lumbar spinal stenosis    Mucus plugging of bronchi    Pneumonia    Pulmonary nodule    Sepsis (HCC)    Sinus tachycardia    Squamous cell carcinoma, arm, left    Tobacco abuse     Past Surgical History: Past Surgical History:  Procedure Laterality Date   ANTERIOR CERVICAL DECOMP/DISCECTOMY FUSION N/A 01/01/2021   Procedure: C5-6 ANTERIOR CERVICAL DECOMPRESSION/DISCECTOMY FUSION 1 LEVEL;  Surgeon: Clois Fret, MD;  Location: [facility];  Service: Neurosurgery;  Laterality: N/A;   Excision skin lesion- skin cancer     LUMBAR LAMINECTOMY/DECOMPRESSION MICRODISCECTOMY N/A 09/23/2023   Procedure: LUMBAR LAMINECTOMY/DECOMPRESSION MICRODISCECTOMY 1 LEVEL;  Surgeon: Claudene Penne ORN, MD;  Location: [facility];  Service: Neurosurgery;  Laterality: N/A;  L4-5 LAMINECTOMY   VIDEO BRONCHOSCOPY WITH ENDOBRONCHIAL ULTRASOUND N/A 07/24/2022   Procedure: VIDEO BRONCHOSCOPY WITH ENDOBRONCHIAL ULTRASOUND;  Surgeon: Parris Manna, MD;  Location: [facility];   Service: Thoracic;  Laterality: N/A;    Allergies: Allergies as of 01/25/2024   (No Known Allergies)    Medications:  Current Outpatient Medications:    amLODipine  (NORVASC ) 5 MG tablet, Take 5 mg by mouth every morning., Disp: , Rfl:    celecoxib  (CELEBREX ) 100 MG capsule, Take 1 capsule (100 mg total) by mouth 2 (two) times daily., Disp: 60 capsule, Rfl: 0   ibuprofen  (ADVIL ) 200 MG tablet, Take 200 mg by mouth every 6 (six) hours as needed., Disp: , Rfl:    omeprazole (PRILOSEC) 20 MG capsule, Take 20 mg by mouth every morning., Disp: , Rfl:    senna (SENOKOT) 8.6 MG TABS tablet, Take 1 tablet (8.6 mg total) by mouth 2 (two) times daily as needed., Disp: 30 tablet, Rfl: 0   Fluticasone-Umeclidin-Vilant (TRELEGY ELLIPTA) 100-62.5-25 MCG/ACT AEPB, Inhale 1 puff into the lungs daily as needed (shortness of breath). (Patient not taking: Reported on 01/25/2024), Disp: , Rfl:   Social History: Social History   Tobacco Use   Smoking status: Some Days    Types: Cigars   Smokeless tobacco: Current    Types: Chew  Vaping Use   Vaping status: Never Used  Substance Use Topics   Alcohol use: Yes    Alcohol/week: 42.0 standard drinks of alcohol    Types: 42 Cans of beer per week    Comment: 6 pk day   Drug use: Not Currently    Types: Cocaine    Comment: IV use 1980s    Family Medical History: Family History  Problem Relation Age of Onset   Diabetes type II Mother    COPD Mother    Heart attack Mother    Heart attack Father    Dementia Father    Prostate cancer Father    Heart disease Sister     Physical Examination: Vitals:   01/25/24 0759  BP: (!) 156/84    General: Patient is in no apparent distress. Attention to examination is appropriate.  Neck:   Supple.  Full range of motion.  Respiratory: Patient is breathing without any difficulty.   NEUROLOGICAL:     Awake, alert, oriented to person, place, and time.  Speech is clear and fluent.   Cranial Nerves:  Pupils equal round and reactive to light.  Facial tone is symmetric.  Facial sensation is symmetric. Shoulder shrug is symmetric. Tongue protrusion is midline.  There is no pronator drift.  Strength: Side Biceps Triceps Deltoid Interossei Grip Wrist Ext. Wrist Flex.  R 5 5 5 5 5 5 5   L 5 5 5 5 5 5 5    Side Iliopsoas Quads Hamstring PF DF EHL  R 5 5 5 5 5 5   L 5 5 5 5 5 5    Reflexes are 1+ and symmetric at the biceps, triceps, brachioradialis, patella and achilles.   Hoffman's is absent.   Bilateral upper and lower extremity sensation is intact to light touch.    No evidence of dysmetria noted.  Gait  is abnormal - he has pain and holds onto the wall.     Medical Decision Making  Imaging: MR CT spine 01/06/2024 IMPRESSION: 1. C6-7: Severe left foraminal stenosis 2. C3-4: Moderate spinal canal stenosis 3. Mild foraminal stenosis at C2-3 and C3-4; mild spinal canal stenosis at C6-7   Electronically signed by: Franky Stanford MD 01/11/2024 02:32 AM EDT RP Workstation: [id number]  I have personally reviewed the images and agree with the above interpretation.  Assessment and Plan: Mr. Capistran is a pleasant 68 y.o. male with severe lower back pain that starts immediately upon ambulation.  He is having some balance issues which I think are related to this.  I do not think he is a good candidate for lower back surgery.  I would like to refer him to consider spinal cord stimulator.  I do not think he is overtly myelopathic at this time.  He has none of the secondary symptoms of myelopathy such as hand numbness or tingling, weakness in his upper extremities.  I am concerned about his weight loss.  He has lost 25 pounds over the past year in our system.  This has been unintentional.  I will reach out to his oncologist.  I spent a total of 30 minutes in this patient's care today. This time was spent reviewing pertinent records including imaging studies, obtaining and confirming history,  performing a directed evaluation, formulating and discussing my recommendations, and documenting the visit within the medical record.   Thank you for involving me in the care of this patient.      Yeira Gulden K. Clois MD, [facility] Neurosurgery

## 2024-01-18 NOTE — Telephone Encounter (Signed)
 Spoke with pt 10/08 / & spoke with Lyle & per Dr. Claudene ok to schedule with Dr. CINDERELLA.

## 2024-01-25 ENCOUNTER — Other Ambulatory Visit: Payer: Self-pay | Admitting: *Deleted

## 2024-01-25 ENCOUNTER — Ambulatory Visit: Admitting: Neurosurgery

## 2024-01-25 ENCOUNTER — Encounter: Payer: Self-pay | Admitting: Neurosurgery

## 2024-01-25 VITALS — BP 156/84 | Ht 72.0 in | Wt 169.0 lb

## 2024-01-25 DIAGNOSIS — R2681 Unsteadiness on feet: Secondary | ICD-10-CM | POA: Diagnosis not present

## 2024-01-25 DIAGNOSIS — R634 Abnormal weight loss: Secondary | ICD-10-CM

## 2024-01-25 DIAGNOSIS — M961 Postlaminectomy syndrome, not elsewhere classified: Secondary | ICD-10-CM | POA: Diagnosis not present

## 2024-01-25 DIAGNOSIS — C3411 Malignant neoplasm of upper lobe, right bronchus or lung: Secondary | ICD-10-CM

## 2024-01-25 NOTE — Patient Instructions (Signed)
 [facility] - televisits [phone] Not in network with [facility] - televisits [phone] Not in network with [facility] 2024 [facility] in [place] Provider: Duwaine [place], [place] [phone] Accepts [facility] Dr Corina in [place] [phone] Accepts [facility], but is typically booked out about 10 months Dr Achilles in [place] [phone]  [facility] (offices in [place], [place], and [place]) [phone]   Please let us  know which one of the above psychologist's you would like to see for evaluation prior to the spinal cord stimulator trial. These are the only providers we are aware of that perform this type of evaluation. Once we fax the referral, please call them to set up an appointment (they do not typically call you).

## 2024-02-08 ENCOUNTER — Ambulatory Visit
Admission: RE | Admit: 2024-02-08 | Discharge: 2024-02-08 | Disposition: A | Discharge status: Home / Self Care | Source: Ambulatory Visit | Attending: Oncology | Admitting: Oncology

## 2024-02-08 DIAGNOSIS — R634 Abnormal weight loss: Secondary | ICD-10-CM | POA: Insufficient documentation

## 2024-02-08 DIAGNOSIS — I700 Atherosclerosis of aorta: Secondary | ICD-10-CM | POA: Diagnosis not present

## 2024-02-08 DIAGNOSIS — J90 Pleural effusion, not elsewhere classified: Secondary | ICD-10-CM | POA: Diagnosis not present

## 2024-02-08 DIAGNOSIS — Z85118 Personal history of other malignant neoplasm of bronchus and lung: Secondary | ICD-10-CM | POA: Diagnosis not present

## 2024-02-08 DIAGNOSIS — C3411 Malignant neoplasm of upper lobe, right bronchus or lung: Secondary | ICD-10-CM | POA: Insufficient documentation

## 2024-02-08 DIAGNOSIS — N281 Cyst of kidney, acquired: Secondary | ICD-10-CM | POA: Diagnosis not present

## 2024-02-08 MED ORDER — IOHEXOL 300 MG/ML  SOLN
100.0000 mL | Freq: Once | INTRAMUSCULAR | Status: AC | PRN
Start: 2024-02-08 — End: 2024-02-08
  Administered 2024-02-08: 100 mL via INTRAVENOUS

## 2024-02-15 ENCOUNTER — Encounter: Payer: Self-pay | Admitting: Oncology

## 2024-02-15 ENCOUNTER — Inpatient Hospital Stay: Attending: Oncology | Admitting: Oncology

## 2024-02-15 VITALS — BP 113/75 | HR 93 | Temp 98.3°F | Resp 19 | Wt 164.9 lb

## 2024-02-15 DIAGNOSIS — C3411 Malignant neoplasm of upper lobe, right bronchus or lung: Secondary | ICD-10-CM | POA: Diagnosis not present

## 2024-02-15 DIAGNOSIS — F17290 Nicotine dependence, other tobacco product, uncomplicated: Secondary | ICD-10-CM | POA: Diagnosis not present

## 2024-02-15 NOTE — Progress Notes (Signed)
 [facility]  [phone] [phone]  ID: David Clark Seen OB: [birth year]  MR#: [id number]  RDW#:[id number]  Patient Care Team: Cleotilde Oneil FALCON, MD as PCP - General (Internal Medicine) Verdene Gills, RN as Oncology Nurse Navigator [name], Evalene PARAS, MD as Consulting Physician (Oncology)   CHIEF COMPLAINT: Stage IA2 adenocarcinoma of the right upper lobe lung.    INTERVAL HISTORY: Patient returns to clinic today for further evaluation and discussion of his CT scan results.  He continues to have significant back pain, weakness and fatigue, and weight loss.  He has no neurologic complaints.  He denies any recent fevers.  He denies any chest pain, shortness of breath, cough, or hemoptysis.  He denies any nausea, vomiting, constipation, or diarrhea.  He has no urinary complaints.  Patient offers no further specific complaints today.    REVIEW OF SYSTEMS:   Review of Systems  Constitutional:  Positive for malaise/fatigue and weight loss. Negative for fever.  Respiratory: Negative.  Negative for cough, hemoptysis and shortness of breath.   Cardiovascular: Negative.  Negative for chest pain and leg swelling.  Gastrointestinal: Negative.  Negative for abdominal pain.  Genitourinary: Negative.  Negative for frequency.  Musculoskeletal:  Positive for back pain.  Skin: Negative.  Negative for rash.  Neurological:  Positive for weakness. Negative for dizziness, focal weakness and headaches.  Psychiatric/Behavioral: Negative.  The patient is not nervous/anxious.     As per HPI. Otherwise, a complete review of systems is negative.  PAST MEDICAL HISTORY: Past Medical History:  Diagnosis Date   Adenocarcinoma of upper lobe of right lung (HCC)    Alcohol abuse    Arthritis    BPH (benign prostatic hyperplasia)    Cervical disc disease    COPD (chronic obstructive pulmonary disease) (HCC)    COVID    Family history of adverse reaction to anesthesia     dad-delayed emergence   GERD (gastroesophageal reflux disease)    Gout    Hepatitis B 1993   History of cocaine abuse (HCC)    in the 1980's   HLD (hyperlipidemia)    Hypertension    Hypomagnesemia    Hyponatremia    Lumbar spinal stenosis    Mucus plugging of bronchi    Pneumonia    Pulmonary nodule    Sepsis (HCC)    Sinus tachycardia    Squamous cell carcinoma, arm, left    Tobacco abuse     PAST SURGICAL HISTORY: Past Surgical History:  Procedure Laterality Date   ANTERIOR CERVICAL DECOMP/DISCECTOMY FUSION N/A 01/01/2021   Procedure: C5-6 ANTERIOR CERVICAL DECOMPRESSION/DISCECTOMY FUSION 1 LEVEL;  Surgeon: Clois Fret, MD;  Location: [facility];  Service: Neurosurgery;  Laterality: N/A;   Excision skin lesion- skin cancer     LUMBAR LAMINECTOMY/DECOMPRESSION MICRODISCECTOMY N/A 09/23/2023   Procedure: LUMBAR LAMINECTOMY/DECOMPRESSION MICRODISCECTOMY 1 LEVEL;  Surgeon: Claudene Penne ORN, MD;  Location: [facility];  Service: Neurosurgery;  Laterality: N/A;  L4-5 LAMINECTOMY   VIDEO BRONCHOSCOPY WITH ENDOBRONCHIAL ULTRASOUND N/A 07/24/2022   Procedure: VIDEO BRONCHOSCOPY WITH ENDOBRONCHIAL ULTRASOUND;  Surgeon: Parris Manna, MD;  Location: [facility];  Service: Thoracic;  Laterality: N/A;    FAMILY HISTORY: Family History  Problem Relation Age of Onset   Diabetes type II Mother    COPD Mother    Heart attack Mother    Heart attack Father    Dementia Father    Prostate cancer Father    Heart disease Sister     ADVANCED DIRECTIVES (Y/N):  N  HEALTH MAINTENANCE: Social History   Tobacco Use   Smoking status: Some Days    Types: Cigars   Smokeless tobacco: Current    Types: Chew  Vaping Use   Vaping status: Never Used  Substance Use Topics   Alcohol use: Yes    Alcohol/week: 42.0 standard drinks of alcohol    Types: 42 Cans of beer per week    Comment: 6 pk day   Drug use: Not Currently    Types: Cocaine    Comment: IV use 1980s      Colonoscopy:  PAP:  Bone density:  Lipid panel:  No Known Allergies  Current Outpatient Medications  Medication Sig Dispense Refill   amLODipine  (NORVASC ) 5 MG tablet Take 5 mg by mouth every morning.     ibuprofen  (ADVIL ) 200 MG tablet Take 200 mg by mouth every 6 (six) hours as needed.     omeprazole (PRILOSEC) 20 MG capsule Take 20 mg by mouth every morning.     senna (SENOKOT) 8.6 MG TABS tablet Take 1 tablet (8.6 mg total) by mouth 2 (two) times daily as needed. 30 tablet 0   celecoxib  (CELEBREX ) 100 MG capsule Take 1 capsule (100 mg total) by mouth 2 (two) times daily. (Patient not taking: Reported on 02/15/2024) 60 capsule 0   Fluticasone-Umeclidin-Vilant (TRELEGY ELLIPTA) 100-62.5-25 MCG/ACT AEPB Inhale 1 puff into the lungs daily as needed (shortness of breath). (Patient not taking: Reported on 02/15/2024)     No current facility-administered medications for this visit.    OBJECTIVE: Vitals:   02/15/24 1059  BP: 113/75  Pulse: 93  Resp: 19  Temp: 98.3 F (36.8 C)  SpO2: 99%      Body mass index is 22.36 kg/m.    ECOG FS:1 - Symptomatic but completely ambulatory  General: Well-developed, well-nourished, no acute distress. Eyes: Pink conjunctiva, anicteric sclera. HEENT: Normocephalic, moist mucous membranes. Lungs: No audible wheezing or coughing. Heart: Regular rate and rhythm. Abdomen: Soft, nontender, no obvious distention. Musculoskeletal: No edema, cyanosis, or clubbing. Neuro: Alert, answering all questions appropriately. Cranial nerves grossly intact. Skin: No rashes or petechiae noted. Psych: Normal affect.  LAB RESULTS:  Lab Results  Component Value Date   NA 136 01/05/2023   K 4.7 01/05/2023   CL 100 01/05/2023   CO2 26 01/05/2023   GLUCOSE 112 (H) 01/05/2023   BUN 9 01/05/2023   CREATININE 0.80 01/05/2023   CALCIUM 9.4 01/05/2023   PROT 8.1 01/05/2023   ALBUMIN 4.2 01/05/2023   AST 40 01/05/2023   ALT 31 01/05/2023   ALKPHOS 78  01/05/2023   BILITOT 1.0 01/05/2023   GFRNONAA >60 01/05/2023    Lab Results  Component Value Date   WBC 5.6 01/05/2023   NEUTROABS 3.1 01/05/2023   HGB 15.0 01/05/2023   HCT 40.0 01/05/2023   MCV 100.0 01/05/2023   PLT 199 01/05/2023     STUDIES: CT CHEST ABDOMEN PELVIS W CONTRAST Result Date: 02/13/2024 EXAM: CT CHEST, ABDOMEN AND PELVIS WITH CONTRAST 02/08/2024 09:56:32 AM TECHNIQUE: CT of the chest, abdomen and pelvis was performed with the administration of intravenous contrast. Multiplanar reformatted images are provided for review. Automated exposure control, iterative reconstruction, and/or weight based adjustment of the mA/kV was utilized to reduce the radiation dose to as low as reasonably achievable. CONTRAST: 100 mL of Omnipaque  300. COMPARISON: Chest CT PET of 10/22/2023, CT PET of 12/29/2022, and clinic note of 11/16/2023. CLINICAL HISTORY: Follow up weight loss, history of lung cancer, Malignant  neoplasm of upper lobe of right lung. Radiation therapy completed in June of 2024. * Tracking Code: BO * FINDINGS: CHEST: MEDIASTINUM AND LYMPH NODES: Heart and pericardium are unremarkable. The central airways are clear. No mediastinal, hilar, axillary, or supraclavicular lymphadenopathy. 3 vessel coronary artery calcification. Enlarged main pulmonary arteries including a right pulmonary artery of 2.8 cm. No central pulmonary embolism on this non-dedicated exam. LUNGS AND PLEURA: Right upper lobe area of nodular consolidation is more well defined today, measuring 3.2 x 2.4 cm on image 68 / 4. The previously described nodule is likely obscured, possibly identified inferiorly at 1.4 x 1.0 cm on image 70 / 4 (compare 2.0 x 1.7 cm on the prior). Increased surrounding ground glass and septal thickening, primarily laterally. Left upper lobe pulmonary nodule of 4 mm is similar on image 64 / 4. The 3 mm right lower lobe pulmonary nodule on the prior is no longer identified. Moderate centrilobular  emphysema. Trace right pleural fluid is new. No pneumothorax. ABDOMEN AND PELVIS: LIVER: Subtle hypoattenuation along the anterior right hepatic capsule of maximally 1.3 cm on image 62 / 2 is not readily apparent on the prior. GALLBLADDER AND BILE DUCTS: Gallbladder is unremarkable. No biliary ductal dilatation. SPLEEN: No acute abnormality. PANCREAS: No acute abnormality. ADRENAL GLANDS: No acute abnormality. KIDNEYS, URETERS AND BLADDER: Bilateral renal cysts measuring up to 9 mm do not warrant specific imaging follow up. No stones in the kidneys or ureters. No hydronephrosis. No perinephric or periureteral stranding. Urinary bladder is unremarkable. GI AND BOWEL: Proximal gastric underdistention. Normal terminal ileum and appendix. Normal small bowel. There is no bowel obstruction. REPRODUCTIVE ORGANS: No acute abnormality. PERITONEUM AND RETROPERITONEUM: No significant free fluid. No free intraperitoneal air. No omental/peritoneal metastasis. Minimal motion degradation in the mid abdomen. VASCULATURE: Aortic atherosclerosis. ABDOMINAL AND PELVIS LYMPH NODES: No lymphadenopathy. BONES AND SOFT TISSUES: L4 laminectomies. No acute osseous abnormality. No focal soft tissue abnormality. IMPRESSION: 1. Increase in presumed radiation induced right upper lobe consolidation. This likely obscures a portion of the pulmonary nodule, which may be identified inferiorly. 2. No typical evidence of metastatic disease within the chest, abdomen, or pelvis. 3. New trace right pleural fluid. 4. Subtle hypoattenuation along the capsule of the anterior right hepatic lobe is indeterminate, possibly an area of focal steatosis with adjacent perihepatic fluid. felt unlikely to represent metastatic disease. This could either be reevaluated on routine CT follow-up at 6 months or more entirely characterized with PET. 5. pulmonary artery enlargement suggests pulmonary arterial hypertension. 6. Incidental findings, including: Aortic  atherosclerosis (icd10-i70.0), coronary artery atherosclerosis, and emphysema (icd10-j43.9). Electronically signed by: Rockey Kilts MD 02/13/2024 11:30 AM EST RP Workstation: [id number]    ASSESSMENT: Stage IA2 adenocarcinoma of the right upper lobe lung.  PLAN:    Stage IA2 adenocarcinoma of the right upper lobe lung: Diagnosis confirmed by bronchoscopy on July 24, 2022.  PET scan results from July 08, 2022 reviewed independently with no obvious evidence of disease outside his known pulmonary nodule.  It was determined patient was not a surgical candidate and he proceeded directly with XRT completing in June 2024.  His most recent CT scan of the chest on February 08, 2024 reviewed independently and reported as above with no obvious evidence of recurrent or progressive disease.  No intervention is needed.  Return to clinic in 6 months with repeat imaging and further evaluation.  Shortness of breath/cough: Patient does not complain of this today.  Secondary to underlying COPD.  Continue follow-up with pulmonary  as scheduled.   Back pain: Chronic and unchanged.  Unrelated to malignancy.  Patient reports neurosurgery has suggested an implantable TENS unit which he has refused. Weight loss: Possibly related to underlying pain and pulmonary disease.  CT results from February 08, 2024 also included abdomen and pelvis which did not reveal any significant pathology.     Patient expressed understanding and was in agreement with this plan. He also understands that He can call clinic at any time with any questions, concerns, or complaints.    Cancer Staging  Adenocarcinoma, lung, right [facility]) Staging form: Lung, AJCC 8th Edition - Clinical stage from 07/30/2022: Stage IA2 (cT1b, cN0, cM0) - Signed by [name] Evalene PARAS, MD on 07/30/2022 Stage prefix: Initial diagnosis   Evalene PARAS [name], MD   02/15/2024 1:51 PM

## 2024-03-06 DIAGNOSIS — R739 Hyperglycemia, unspecified: Secondary | ICD-10-CM | POA: Diagnosis not present

## 2024-03-06 DIAGNOSIS — Z125 Encounter for screening for malignant neoplasm of prostate: Secondary | ICD-10-CM | POA: Diagnosis not present

## 2024-03-06 DIAGNOSIS — Z79899 Other long term (current) drug therapy: Secondary | ICD-10-CM | POA: Diagnosis not present

## 2024-03-06 DIAGNOSIS — E782 Mixed hyperlipidemia: Secondary | ICD-10-CM | POA: Diagnosis not present

## 2024-03-07 DIAGNOSIS — Z79899 Other long term (current) drug therapy: Secondary | ICD-10-CM | POA: Diagnosis not present

## 2024-03-07 DIAGNOSIS — C3411 Malignant neoplasm of upper lobe, right bronchus or lung: Secondary | ICD-10-CM | POA: Diagnosis not present

## 2024-03-07 DIAGNOSIS — Z0000 Encounter for general adult medical examination without abnormal findings: Secondary | ICD-10-CM | POA: Diagnosis not present

## 2024-03-07 DIAGNOSIS — E440 Moderate protein-calorie malnutrition: Secondary | ICD-10-CM | POA: Diagnosis not present

## 2024-03-07 DIAGNOSIS — E782 Mixed hyperlipidemia: Secondary | ICD-10-CM | POA: Diagnosis not present

## 2024-05-18 ENCOUNTER — Ambulatory Visit

## 2024-05-24 ENCOUNTER — Ambulatory Visit: Admitting: Oncology

## 2024-08-14 ENCOUNTER — Other Ambulatory Visit

## 2024-08-21 ENCOUNTER — Inpatient Hospital Stay: Admitting: Oncology
